# Patient Record
Sex: Male | Born: 1955 | Race: White | Hispanic: No | Marital: Married | Smoking: Former smoker
Health system: Southern US, Community
[De-identification: ages and names within clinical notes are randomized; demographics above are authoritative.]

## PROBLEM LIST (undated history)

## (undated) DIAGNOSIS — M1990 Unspecified osteoarthritis, unspecified site: Secondary | ICD-10-CM

## (undated) DIAGNOSIS — E785 Hyperlipidemia, unspecified: Secondary | ICD-10-CM

## (undated) DIAGNOSIS — N529 Male erectile dysfunction, unspecified: Secondary | ICD-10-CM

## (undated) DIAGNOSIS — Z9989 Dependence on other enabling machines and devices: Secondary | ICD-10-CM

## (undated) DIAGNOSIS — R42 Dizziness and giddiness: Secondary | ICD-10-CM

## (undated) DIAGNOSIS — E119 Type 2 diabetes mellitus without complications: Secondary | ICD-10-CM

## (undated) DIAGNOSIS — G4733 Obstructive sleep apnea (adult) (pediatric): Secondary | ICD-10-CM

## (undated) DIAGNOSIS — R51 Headache: Secondary | ICD-10-CM

## (undated) DIAGNOSIS — R519 Headache, unspecified: Secondary | ICD-10-CM

## (undated) DIAGNOSIS — I10 Essential (primary) hypertension: Secondary | ICD-10-CM

## (undated) DIAGNOSIS — R209 Unspecified disturbances of skin sensation: Secondary | ICD-10-CM

## (undated) HISTORY — DX: Headache, unspecified: R51.9

## (undated) HISTORY — DX: Dependence on other enabling machines and devices: Z99.89

## (undated) HISTORY — DX: Essential (primary) hypertension: I10

## (undated) HISTORY — DX: Unspecified osteoarthritis, unspecified site: M19.90

## (undated) HISTORY — DX: Obstructive sleep apnea (adult) (pediatric): G47.33

## (undated) HISTORY — DX: Male erectile dysfunction, unspecified: N52.9

## (undated) HISTORY — DX: Type 2 diabetes mellitus without complications: E11.9

## (undated) HISTORY — DX: Headache: R51

## (undated) HISTORY — DX: Dizziness and giddiness: R42

## (undated) HISTORY — DX: Unspecified disturbances of skin sensation: R20.9

## (undated) HISTORY — DX: Hyperlipidemia, unspecified: E78.5

---

## 2009-04-11 ENCOUNTER — Emergency Department (HOSPITAL_COMMUNITY): Admission: EM | Admit: 2009-04-11 | Discharge: 2009-04-11 | Payer: Self-pay | Admitting: Emergency Medicine

## 2009-09-19 ENCOUNTER — Encounter: Payer: Self-pay | Admitting: Neurology

## 2013-02-28 ENCOUNTER — Encounter: Payer: Self-pay | Admitting: Neurology

## 2013-03-12 ENCOUNTER — Ambulatory Visit (INDEPENDENT_AMBULATORY_CARE_PROVIDER_SITE_OTHER): Payer: BC Managed Care – PPO | Admitting: Neurology

## 2013-03-12 ENCOUNTER — Encounter (INDEPENDENT_AMBULATORY_CARE_PROVIDER_SITE_OTHER): Payer: Self-pay

## 2013-03-12 ENCOUNTER — Encounter: Payer: Self-pay | Admitting: Neurology

## 2013-03-12 VITALS — BP 124/80 | HR 77 | Resp 16 | Ht 72.0 in | Wt 258.0 lb

## 2013-03-12 DIAGNOSIS — I10 Essential (primary) hypertension: Secondary | ICD-10-CM

## 2013-03-12 DIAGNOSIS — E1149 Type 2 diabetes mellitus with other diabetic neurological complication: Secondary | ICD-10-CM

## 2013-03-12 DIAGNOSIS — G4733 Obstructive sleep apnea (adult) (pediatric): Secondary | ICD-10-CM

## 2013-03-12 DIAGNOSIS — Z9989 Dependence on other enabling machines and devices: Principal | ICD-10-CM

## 2013-03-12 NOTE — Progress Notes (Addendum)
[facility] SLEEP MEDICINE CONSULT   Provider:  Melvyn Novas, M D  Referring Provider: Allean Found, * Primary Care Physician:  Delphia Grates  Chief Complaint  Patient presents with  . New Evaluation    Room   . sleep consult    HPI:  Darin Conner is a 58 y.o. male  Is seen here as a referral  from Dr. Merri Brunette at [facility], for an patient  That has been tested and diagnosed with sleep apnea , and is  treated with CPAP.    Darin Conner, a 58 year old Caucasian right-handed gentleman, presents here as a new patient to the sleep clinic.  Darin Conner reports, that he had been just recently tested for sleep apnea and was diagnosed with obstructive sleep apnea at [facility] in [facility].  From there, he was referred to a durable medical equipment company and as he stated " he had some disagreement " with the CPAP issuing DME.   The patient felt excessively sleepy in  daytime and has complained of a lack of energy when he was originally referred for his sleep study.  He works regular daytime hours, beginning at 8 to 4:30 PM. His sleep habits are as follow between pen 30 and 11 he will fall asleep fairly promptly he has nocturia break, on a regular night 1 or 2, and he has to arise at 5-5:30 AM. His wife will set an alarm , yet he wakes up frequently spontaneously. He noted being alert and much more  refreshed in the morning since using CPAP compliantly.  The sleep studies documented the following: The patient had a sleep latency of 32 minutes and a sleep efficiency of only 71.2%.  He slept for more than 40% of the night and supine sleep position- but the REM sleep was only noted when he slept on his left side.  An  AHI index of 81.4 was stated for the left lateral position and a 54.24 the supine position. The RDIs were similar.  AHI for  total sleep time was 48.4 /hr. . CPAP titration was initiated and the patient titrated to 11 cm  water ,with an AHI of 0.0 . There was 100% supine sleep and it was noted that his hypoxemia resolved on the CPAP. He developed periodic limb movements with CPAP therapy.  He was fitted with a full face mask which began pinching his nose and he still has visible.pressure marks. He felt uncomfortable "had to  Negotiate" with the [employer] to get a new mask.  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  PMHx: Dr. Merri Brunette office stated that the patient has a known history of diabetes which has  become less controlled over the last 12 months. He was treated for essential hypertension, mainly with diuretics and calcium channel blocker,  and he has gastroesophageal reflux disease.He used to be a patient of Dr. Elvera Lennox.  Laboratory results from [facility] physic and an increase in HB A1c now at 5.9, his cholesterol panel on treatment had improved.  There were normal liver and kidney function snorted. The patient requested a referral to see a doctor for sleep apnea followup. He was last seen on 01/08/2013 at the time was a very high fasting glucose of 180 mg/dL.   Review of Systems: Out of a complete 14 system review, the patient complains of only the following symptoms, and all other reviewed systems are negative.  The patient  endorses fatigue severity scale at 19 points and the Epworth sleepiness score at 8 points while  on treatment.  I was able to see a compliance record obtained here in office today encompassing 3 months of use. 102 days the patient has 100% compliance and a residual AHI of 2.2 the time using CPAP daily at 6 hours and 52 minutes.  Overall the patient is a storybook compliant patient.  CPAP set at 11 cm water.    History   Social History  . Marital Status: Married    Spouse Name: Joni Reiningicole    Number of Children: 5  . Years of Education: GED   Occupational History  . Carpenter    Social History Main Topics  . Smoking status: Former Games developermoker  . Smokeless  tobacco: Never Used     Comment: 2003  . Alcohol Use: 0.0 oz/week     Comment: twice a week  . Drug Use: No  . Sexual Activity: Not on file   Other Topics Concern  . Not on file   Social History Narrative   Patient is married Joni Reining(Nicole).   Patient has five children.   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   Patient has a Ged.   Patient drinks 3 cups of coffee daily.   Patient is right-handed.             Family History  Problem Relation Age of Onset  . Diabetes Father   . Prostate cancer Father   . Cancer Paternal Aunt   . Diabetes Sister     Past Medical History  Diagnosis Date  . Hyperlipidemia   . Diabetes   . Erectile dysfunction   . Osteoarthritis   . Disturbance of skin sensation   . OSA on CPAP   . Hypertension   . Headache     History reviewed. No pertinent past surgical history.  Current Outpatient Prescriptions  Medication Sig Dispense Refill  . amLODipine (NORVASC) 5 MG tablet Take 5 mg by mouth daily.      Marland Kitchen. aspirin 81 MG tablet Take 81 mg by mouth daily.      . Glucosamine-Chondroitin (COSAMIN DS PO) Take 1 capsule by mouth daily.      Marland Kitchen. glucose blood test strip 1 each by Other route 2 (two) times daily. Use as instructed      . metFORMIN (GLUCOPHAGE-XR) 500 MG 24 hr tablet Take 500 mg by mouth daily with breakfast.      . Omega-3 Fatty Acids (OMEGA 3 PO) Take 1 capsule by mouth daily.      . pravastatin (PRAVACHOL) 20 MG tablet Take 20 mg by mouth daily.      Marland Kitchen. triamterene-hydrochlorothiazide (DYAZIDE) 37.5-25 MG per capsule Take 1 capsule by mouth daily.      . valsartan (DIOVAN) 320 MG tablet Take 320 mg by mouth daily.       No current facility-administered medications for this visit.    Allergies as of 03/12/2013 - Review Complete 03/12/2013  Allergen Reaction Noted  . Penicillins  03/12/2013    Vitals: BP 124/80  Pulse 77  Resp 16  Ht 6' (1.829 m)  Wt 258 lb (117.028 kg)  BMI 34.98 kg/m2 Last Weight:   Wt Readings from Last 1 Encounters:  03/12/13 258 lb (117.028 kg)   Last Height:   Ht Readings from Last 1 Encounters:  03/12/13 6' (1.829 m)    Physical exam:  General: The patient is awake, alert and appears  not in acute distress. The patient is well groomed. Head: Normocephalic, atraumatic. Neck is supple. Mallampati 3 , neck circumference: 15.5  Cardiovascular:  Regular rate and rhythm , without  murmurs or carotid bruit, and without distended neck veins. Respiratory: Lungs are clear to auscultation. Skin:  Without evidence of edema, or rash Trunk: BMI is elevated , the patient has a normal posture.  Neurologic exam : The patient is awake and alert, oriented to place and time.  Memory subjective described as intact.  There is a normal attention span & concentration ability. Speech is fluent without  dysarthria, dysphonia or aphasia.  Mood and affect are appropriate.  Cranial nerves: Pupils are equal and briskly reactive to light. Funduscopic exam without  evidence of pallor or edema. Extraocular movements  in vertical and horizontal planes intact and without nystagmus. Visual fields by finger perimetry are intact. Hearing to finger rub intact.  Facial sensation intact to fine touch. Facial motor strength is symmetric and tongue and uvula move midline.  Motor exam:   Normal tone and normal muscle bulk and symmetric normal strength in all extremities.  Sensory:  Fine touch, pinprick and vibration were tested in all extremities. He has lost vibration on both feet, all toes up to the ankle. His feet are warm.  Proprioception in upper extremities is normal.  Coordination: Rapid alternating movements in the fingers/hands is tested and normal. Finger-to-nose maneuver tested and normal without evidence of ataxia, dysmetria or tremor.  Gait and station: Patient walks without assistive device. Deep tendon reflexes: in the  upper and lower extremities are all attenuated, achilles is  absent  Babinski maneuver response is difficult to elicit , but downgoing.   Assessment:  After physical and neurologic examination, review of laboratory studies, imaging, neurophysiology testing and pre-existing records, assessment is  Severe OSA with REM accentuation. He needs no new supplies at this time, established 100% compliance.  He feels the pressure is a little to high , and i will reset the machine from 12 to 11 cm. With a flex function.   Plan:  Treatment plan and additional workup :  Diabetic, overweight patient with HTN, patient is educated about low carb diet but fails to implement these.  The patient has OSA and will use CPAP at 11 cm water with a 2 cm flex. He will follow up once a year with the CPAP, Epworth .   Nasal saline spray recommended for nasal rhinitis , dry nostril irritation.   Neuropathy is present -likely diabetic.

## 2013-03-12 NOTE — Patient Instructions (Signed)
Apnea CPAP and BIPAP Information CPAP and BIPAP are methods of helping you breathe with the use of air pressure. CPAP stands for "continuous positive airway pressure." BIPAP stands for "bi-level positive airway pressure." In both methods, air is blown into your air passages to help keep you breathing well. With CPAP, the amount of pressure stays the same while you breathe in and out. CPAP is most commonly used for obstructive sleep apnea. For obstructive sleep apnea, CPAP works by holding your airways open so that they do not collapse when your muscles relax during sleep. BIPAP is similar to CPAP except the amount of pressure is increased when you inhale. This helps you take larger breaths. Your health care provider will recommend whether CPAP or BIPAP would be more helpful for you.  WHY ARE CPAP AND BIPAP TREATMENTS USED? CPAP or BIPAP can be helpful if you have:   Sleep apnea.   Chronic obstructive pulmonary disease (COPD).   Diseases that weaken the muscles of the chest, including muscular dystrophy or neurological diseases such as amyotrophic lateral sclerosis (ALS).   Other problems that cause breathing to be weak, abnormal, or difficult.  HOW IS CPAP OR BIPAP ADMINISTERED? Both CPAP and BIPAP are provided by a small machine with a flexible plastic tube that attaches to a plastic mask. The mask fits on your face, and air is blown into your air passages through your nose or mouth. The amount of pressure that is used to blow the air into your air passages can be set on the machine. Your health care provider will determine the pressure setting that should be used based on your individual needs.  WHEN SHOULD CPAP OR BIPAP BE USED? In most cases, the mask is worn only when sleeping. Generally, you will need to wear the mask throughout the night and during the daytime if you take a nap. In a few cases involving certain medical conditions, people also need to wear the mask at other times when they  are awake. Follow your health care provider's instructions for when to use the machine.  USING THE MASK  Because the mask needs to be snug, some people feel a trapped or closed-in feeling (claustrophobic) when first using the mask. You may need to get used to the mask gradually. To do this, you can first hold the mask loosely over your nose or mouth. Gradually apply the mask more snugly. You can also gradually increase the amount of time that you use the mask.   Masks are available in various types and sizes. Some fit over your mouth and nose, and some fit over just your nose. If your mask does not fit well, talk to your health care provider about getting a different one.  If you are using a nasal mask and you tend to breathe through your mouth, a chin strap may be applied to help keep your mouth closed.   The CPAP and BIPAP machines have alarms that may sound if the mask comes off or develops a leak.   If you have trouble with the mask, it is very important that you talk to your health care provider about finding a way to make the mask easier to tolerate. Do not stop using the mask. This could have a negative impact on your health. TIPS FOR USING THE MACHINE  Place your CPAP or BIPAP machine on a secure table or stand near an electrical outlet.   Know where the on-off switch is located on the machine.  Follow your health care provider's instructions for how to set the pressure on your machine and when you should use it.   Do not eat or drink while the CPAP or BIPAP machine is on. Food or fluids could get pushed into your lungs by the pressure of the CPAP or BIPAP.  Do not smoke. Tobacco smoke residue can damage the machine.   For home use, CPAP and BIPAP machines can be rented or purchased through home health care companies. Many different brands of machines are available. Renting a machine before purchasing may help you find out which particular machine works well for you. SEEK  IMMEDIATE MEDICAL CARE IF:  You have redness or open areas around your nose or mouth where the mask fits.   You have trouble operating the CPAP or BIPAP machine.   You cannot tolerate wearing the CPAP or BIPAP mask.  Document Released: 10/15/2003 Document Revised: 09/18/2012 Document Reviewed: 08/15/2012 ExitCare Patient Information 2014 [employer]. Sleep Apnea  Sleep apnea is a sleep disorder characterized by abnormal pauses in breathing while you sleep. When your breathing pauses, the level of oxygen in your blood decreases. This causes you to move out of deep sleep and into light sleep. As a result, your quality of sleep is poor, and the system that carries your blood throughout your body (cardiovascular system) experiences stress. If sleep apnea remains untreated, the following conditions can develop:  High blood pressure (hypertension).  Coronary artery disease.  Inability to achieve or maintain an erection (impotence).  Impairment of your thought process (cognitive dysfunction). There are three types of sleep apnea: 1. Obstructive sleep apnea Pauses in breathing during sleep because of a blocked airway. 2. Central sleep apnea Pauses in breathing during sleep because the area of the brain that controls your breathing does not send the correct signals to the muscles that control breathing. 3. Mixed sleep apnea A combination of both obstructive and central sleep apnea. RISK FACTORS The following risk factors can increase your risk of developing sleep apnea:  Being overweight.  Smoking.  Having narrow passages in your nose and throat.  Being of older age.  Being male.  Alcohol use.  Sedative and tranquilizer use.  Ethnicity. Among individuals younger than 35 years, African Americans are at increased risk of sleep apnea. SYMPTOMS   Difficulty staying asleep.  Daytime sleepiness and fatigue.  Loss of energy.  Irritability.  Loud, heavy snoring.  Morning  headaches.  Trouble concentrating.  Forgetfulness.  Decreased interest in sex. DIAGNOSIS  In order to diagnose sleep apnea, your caregiver will perform a physical examination. Your caregiver may suggest that you take a home sleep test. Your caregiver may also recommend that you spend the night in a sleep lab. In the sleep lab, several monitors record information about your heart, lungs, and brain while you sleep. Your leg and arm movements and blood oxygen level are also recorded. TREATMENT The following actions may help to resolve mild sleep apnea:  Sleeping on your side.   Using a decongestant if you have nasal congestion.   Avoiding the use of depressants, including alcohol, sedatives, and narcotics.   Losing weight and modifying your diet if you are overweight. There also are devices and treatments to help open your airway:  Oral appliances. These are custom-made mouthpieces that shift your lower jaw forward and slightly open your bite. This opens your airway.  Devices that create positive airway pressure. This positive pressure "splints" your airway open to help you breathe   better during sleep. The following devices create positive airway pressure:  Continuous positive airway pressure (CPAP) device. The CPAP device creates a continuous level of air pressure with an air pump. The air is delivered to your airway through a mask while you sleep. This continuous pressure keeps your airway open.  Nasal expiratory positive airway pressure (EPAP) device. The EPAP device creates positive air pressure as you exhale. The device consists of single-use valves, which are inserted into each nostril and held in place by adhesive. The valves create very little resistance when you inhale but create much more resistance when you exhale. That increased resistance creates the positive airway pressure. This positive pressure while you exhale keeps your airway open, making it easier to breath when you  inhale again.  Bilevel positive airway pressure (BPAP) device. The BPAP device is used mainly in patients with central sleep apnea. This device is similar to the CPAP device because it also uses an air pump to deliver continuous air pressure through a mask. However, with the BPAP machine, the pressure is set at two different levels. The pressure when you exhale is lower than the pressure when you inhale.  Surgery. Typically, surgery is only done if you cannot comply with less invasive treatments or if the less invasive treatments do not improve your condition. Surgery involves removing excess tissue in your airway to create a wider passage way. Document Released: 01/06/2002 Document Revised: 05/13/2012 Document Reviewed: 05/25/2011 ExitCare Patient Information 2014 [employer].  

## 2013-03-14 ENCOUNTER — Encounter: Payer: Self-pay | Admitting: Neurology

## 2013-12-17 ENCOUNTER — Encounter: Payer: Self-pay | Admitting: Neurology

## 2014-03-12 ENCOUNTER — Encounter: Payer: Self-pay | Admitting: Neurology

## 2014-03-12 ENCOUNTER — Ambulatory Visit (INDEPENDENT_AMBULATORY_CARE_PROVIDER_SITE_OTHER): Payer: BLUE CROSS/BLUE SHIELD | Admitting: Neurology

## 2014-03-12 ENCOUNTER — Ambulatory Visit: Payer: BC Managed Care – PPO | Admitting: Nurse Practitioner

## 2014-03-12 VITALS — BP 117/74 | HR 71 | Resp 18 | Ht 71.75 in | Wt 250.0 lb

## 2014-03-12 DIAGNOSIS — R5383 Other fatigue: Secondary | ICD-10-CM

## 2014-03-12 DIAGNOSIS — E1310 Other specified diabetes mellitus with ketoacidosis without coma: Secondary | ICD-10-CM

## 2014-03-12 DIAGNOSIS — E1110 Type 2 diabetes mellitus with ketoacidosis without coma: Secondary | ICD-10-CM | POA: Insufficient documentation

## 2014-03-12 DIAGNOSIS — Z9989 Dependence on other enabling machines and devices: Principal | ICD-10-CM

## 2014-03-12 DIAGNOSIS — R351 Nocturia: Secondary | ICD-10-CM

## 2014-03-12 DIAGNOSIS — G479 Sleep disorder, unspecified: Secondary | ICD-10-CM | POA: Insufficient documentation

## 2014-03-12 DIAGNOSIS — G4733 Obstructive sleep apnea (adult) (pediatric): Secondary | ICD-10-CM

## 2014-03-12 NOTE — Progress Notes (Signed)
[facility] SLEEP MEDICINE CONSULT   Provider:  Melvyn Novas, M D  Referring Provider: Elvera Lennox, PA-C Primary Care Physician:  Leanor Rubenstein, MD  Chief Complaint  Patient presents with  . RV cpap    Rm 11, alone    HPI:  Darin Conner is a 59 y.o. male  Is seen here as a referral  from Dr. Ellison Hughs at [facility], for an patient  That has been tested and diagnosed with sleep apnea , and is  treated with CPAP.    Darin Conner, a 59 year old Caucasian right-handed gentleman, presents here as a new patient to the sleep clinic. PMHx: Dr. Merri Brunette office stated that the patient has a known history of diabetes which has  become less controlled over the last 12 months. He was treated for essential hypertension, mainly with diuretics and calcium channel blocker,  and he has gastroesophageal reflux disease.He used to be a patient of Dr. Elvera Lennox.  Laboratory results from [facility] physic and an increase in HB A1c now at 5.9, his cholesterol panel on treatment had improved.  There were normal liver and kidney function snorted. The patient requested a referral to see a doctor for sleep apnea followup.  He was last seen on 01/08/2013 at the time was a very high fasting glucose of 180 mg/dL.  Darin Conner reports, that he had been just recently tested for sleep apnea and was diagnosed with obstructive sleep apnea at [facility] in [facility].  From there, he was referred to a durable medical equipment company and as he stated " he had some disagreement " with the CPAP issuing DME. The patient felt excessively sleepy in  daytime and has complained of a lack of energy when he was originally referred for his sleep study. He works regular daytime hours, beginning at 8 to 4:30 PM. His sleep habits are as follow between pen 30 and 11 he will fall asleep fairly promptly he has nocturia break, on a regular night 1 or 2, and he has to arise at 5-5:30 AM. His wife  will set an alarm , yet he wakes up frequently spontaneously. He noted being alert and much more  refreshed in the morning since using CPAP compliantly.  The sleep studies documented the following: The patient had a sleep latency of 32 minutes and a sleep efficiency of only 71.2%.  He slept for more than 40% of the night and supine sleep position- but the REM sleep was only noted when he slept on his left side. An  AHI index of 81.4 was stated for the left lateral position and a 54.24 the supine position. The RDIs were similar. AHI for  total sleep time was 48.4 /hr. . CPAP titration was initiated and the patient titrated to 11 cm water ,with an AHI of 0.0 . There was 100% supine sleep and it was noted that his hypoxemia resolved on the CPAP. He developed periodic limb movements with CPAP therapy.  He was fitted with a full face mask which began pinching his nose and he still has visible.pressure marks. He felt uncomfortable "had to  Negotiate" with the [employer] to get a new mask. 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  The patient is seen here on 03-12-14 after continued to use his old established CPAP machine with new settings. A download confirmed a 2.2 residual AHI at a CPAP pressure of 12 cm water. This was on 2-10  2015. He feels a little better since he got new equipment mask tubing etc. that he still feels not up to 100%. Also his average residual AHI was very low I wonder if the patient would do better with an auto titration device. His machine is also partially broken, I think in that case he needs a new machine. He has been followed by pediatric specialists as his DME. A download from 03-11-14 obtained in the office shows 100% compliance at 6 hours and 30 minutes average with a average AHI of 2.6 at 11 cm water. Darin Conner  endorsed his fatigue severity scale at 33 points today and the Epworth Sleepiness Scale at 6 points his fatigue score is actually higher than last time  his sleepiness score is lower.  Review of Systems: Out of a complete 14 system review, the patient complains of only the following symptoms, and all other reviewed systems are negative. The patient endorses fatigue severity scale at 33 points and the Epworth sleepiness score at 6 points while  on treatment.   History   Social History  . Marital Status: Married    Spouse Name: Darin Reiningicole  . Number of Children: 5  . Years of Education: GED   Occupational History  . [employer]    Social History Main Topics  . Smoking status: Former Games developermoker  . Smokeless tobacco: Never Used     Comment: Darin Conner  . Alcohol Use: 0.0 oz/week     Comment: twice a week  . Drug Use: No  . Sexual Activity: Not on file   Other Topics Concern  . Not on file   Social History Narrative   Patient is married Darin Conner(Nicole).   Patient has five children.   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   Patient has a Ged.   Patient drinks 3-4 cups of coffee daily.   Patient is right-handed.             Family History  Problem Relation Age of Onset  . Diabetes Father   . Prostate cancer Father   . Cancer Paternal Aunt   . Diabetes Sister     Past Medical History  Diagnosis Date  . Hyperlipidemia   . Diabetes   . Erectile dysfunction   . Osteoarthritis   . Disturbance of skin sensation   . OSA on CPAP   . Hypertension   . Headache     History reviewed. No pertinent past surgical history.  Current Outpatient Prescriptions  Medication Sig Dispense Refill  . aspirin 81 MG tablet Take 81 mg by mouth daily.    . AZOR 5-40 MG per tablet Take 1 tablet by mouth daily.   4  . Glucosamine-Chondroitin (COSAMIN DS PO) Take 1 capsule by mouth daily.    Marland Kitchen. glucose blood test strip 1 each by Other route 2 (two) times daily. Use as instructed    . INVOKANA 300 MG TABS tablet   4  . JANUMET 50-1000 MG per tablet   4  . Omega-3 Fatty Acids (OMEGA 3 PO) Take 1 capsule by mouth daily.    . pravastatin  (PRAVACHOL) 20 MG tablet Take 20 mg by mouth daily.    Marland Kitchen. triamterene-hydrochlorothiazide (DYAZIDE) 37.5-25 MG per capsule Take 1 capsule by mouth daily.    Marland Kitchen. amLODipine (NORVASC) 5 MG tablet Take 5 mg by mouth daily.     No current facility-administered medications for this visit.    Allergies as of 03/12/2014 - Review Complete 03/12/2014  Allergen Reaction Noted  . Penicillins  03/12/2013    Vitals: BP 117/74 mmHg  Pulse 71  Resp 18  Ht 5' 11.75" (1.822 m)  Wt 250 lb (113.399 kg)  BMI 34.16 kg/m2 Last Weight:  Wt Readings from Last 1 Encounters:  03/12/14 250 lb (113.399 kg)   Last Height:   Ht Readings from Last 1 Encounters:  03/12/14 5' 11.75" (1.822 m)    Physical exam:  General: The patient is awake, alert and appears not in acute distress. The patient is well groomed. Head: Normocephalic, atraumatic. Neck is supple. Mallampati 3 , neck circumference: 15.5  Cardiovascular:  Regular rate and rhythm , without  murmurs or carotid bruit, and without distended neck veins. Respiratory: Lungs are clear to auscultation. Skin:  Without evidence of edema, or rash Trunk: BMI is elevated , the patient has a normal posture.  Neurologic exam : The patient is awake and alert, oriented to place and time.  Memory subjective described as intact.  There is a normal attention span & concentration ability. Speech is fluent without  dysarthria, dysphonia or aphasia.  Mood and affect are appropriate.  Cranial nerves: Pupils are equal and briskly reactive to light. Funduscopic exam without  evidence of pallor or edema. Extraocular movements  in vertical and horizontal planes intact and without nystagmus. Visual fields by finger perimetry are intact. Hearing to finger rub intact.  Facial sensation intact to fine touch. Facial motor strength is symmetric and tongue and uvula move midline.   Assessment:  After physical and neurologic examination, review of laboratory studies, imaging,  neurophysiology testing and pre-existing records, assessment is  Severe OSA with REM accentuation. He needs no new supplies at this time, established 100% compliance.  He feels the pressure is a little to high , and i had reset the machine from 12 to 11 cm.  He achieved a low AHI of 2.6 but feels not quite right yet and the machine is broken causes noises that is not reliably blowing. I will therefore order a new machine this time an O2 sat machine with a pressure between 5 and 15 cm water at nasal mask of the patient's choice he can use either the device off format he has used so far or switch to a new one and I would like to refer him back to adult and Pediatric service  specialists DME>   [phone]   Plan:  Treatment plan and additional workup : 1)Diabetic, overweight patient with HTN, patient is educated about low carb diet but fails to implement these.  Has very iregular work hours and is SOB easily, too.  The patient has OSA and will need CPAP, the new machine  Will be auto-set.  5 -15 cm water .  He will follow up once a year with the CPAP, Epworth .   Nasal saline spray recommended for nasal rhinitis , dry nostril irritation. May try a CPAP gel.   NEW machine ordered.

## 2014-04-27 ENCOUNTER — Encounter: Payer: Self-pay | Admitting: Neurology

## 2014-06-11 ENCOUNTER — Encounter: Payer: Self-pay | Admitting: Nurse Practitioner

## 2014-06-11 ENCOUNTER — Ambulatory Visit (INDEPENDENT_AMBULATORY_CARE_PROVIDER_SITE_OTHER): Payer: BLUE CROSS/BLUE SHIELD | Admitting: Nurse Practitioner

## 2014-06-11 VITALS — BP 115/75 | HR 69 | Ht 71.5 in | Wt 253.0 lb

## 2014-06-11 DIAGNOSIS — G479 Sleep disorder, unspecified: Secondary | ICD-10-CM

## 2014-06-11 DIAGNOSIS — R5383 Other fatigue: Secondary | ICD-10-CM

## 2014-06-11 DIAGNOSIS — G4733 Obstructive sleep apnea (adult) (pediatric): Secondary | ICD-10-CM | POA: Diagnosis not present

## 2014-06-11 DIAGNOSIS — Z9989 Dependence on other enabling machines and devices: Principal | ICD-10-CM

## 2014-06-11 NOTE — Patient Instructions (Signed)
Continue CPAP settings same Follow-up yearly and when necessary

## 2014-06-11 NOTE — Progress Notes (Signed)
[facility]  PATIENT: Darin Conner DOB: [birth year]   REASON FOR VISIT: Follow-up for obstructive sleep apnea HISTORY FROM: Patient    HISTORY OF PRESENT ILLNESS: Darin Conner, 59 year old male returns for follow-up for his obstructive sleep apnea. He was last seen in this office by Darin Conner 03/12/2014. At that time he was asked to get a new CPAP machine which he has done. His CPAP download shows 100% compliance at 5 hours and 47 minutes average with a average AHI of 1.0 at auto titration. He claims he is less fatigued however he does state; his medications cause fatigue as well. ESS score is 11. He returns for reevaluation    HISTORY:(CD)Darin Conner, a 59 year old Caucasian right-handed gentleman, presents here as a new patient to the sleep clinic. PMHx: Darin Conner office stated that the patient has a known history of diabetes which has become less controlled over the last 12 months. He was treated for essential hypertension, mainly with diuretics and calcium channel blocker, and he has gastroesophageal reflux disease.He used to be a patient of Darin Conner. Laboratory results from [facility] physic and an increase in HB A1c now at 5.9, his cholesterol panel on treatment had improved.  There were normal liver and kidney function snorted. The patient requested a referral to see a doctor for sleep apnea followup.  He was last seen on 01/08/2013 at the time was a very high fasting glucose of 180 mg/dL.  Darin Conner reports, that he had been just recently tested for sleep apnea and was diagnosed with obstructive sleep apnea at [facility] in [facility].  From there, he was referred to a durable medical equipment company and as he stated " he had some disagreement " with the CPAP issuing DME. The patient felt excessively sleepy in daytime and has complained of a lack of energy when he was originally referred for his sleep study. He works regular daytime  hours, beginning at 8 to 4:30 PM. His sleep habits are as follow between pen 30 and 11 he will fall asleep fairly promptly he has nocturia break, on a regular night 1 or 2, and he has to arise at 5-5:30 AM. His wife will set an alarm , yet he wakes up frequently spontaneously. He noted being alert and much more refreshed in the morning since using CPAP compliantly.  The sleep studies documented the following: The patient had a sleep latency of 32 minutes and a sleep efficiency of only 71.2%.  He slept for more than 40% of the night and supine sleep position- but the REM sleep was only noted when he slept on his left side. An AHI index of 81.4 was stated for the left lateral position and a 54.24 the supine position. The RDIs were similar. AHI for total sleep time was 48.4 /hr. . CPAP titration was initiated and the patient titrated to 11 cm water ,with an AHI of 0.0 . There was 100% supine sleep and it was noted that his hypoxemia resolved on the CPAP. He developed periodic limb movements with CPAP therapy.  He was fitted with a full face mask which began pinching his nose and he still has visible.pressure marks. He felt uncomfortable "had to Negotiate" with the [employer] to get a new mask. 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  The patient is seen here on 03-12-14 after continued to use his old established CPAP machine with new settings. A download confirmed a 2.2 residual  AHI at a CPAP pressure of 12 cm water. This was on 2-10 2015. He feels a little better since he got new equipment mask tubing etc. that he still feels not up to 100%. Also his average residual AHI was very low I wonder if the patient would do better with an auto titration device. His machine is also partially broken, I think in that case he needs a new machine. He has been followed by pediatric specialists as his DME. A download from 03-11-14 obtained in the office shows 100% compliance at 6 hours and 30 minutes  average with a average AHI of 2.6 at 11 cm water. Darin Conner endorsed his fatigue severity scale at 33 points today and the Epworth Sleepiness Scale at 6 points his fatigue score is actually higher than last time his sleepiness score is lower.    REVIEW OF SYSTEMS: Full 14 system review of systems performed and notable only for those listed, all others are neg:  Constitutional: Fatigue Cardiovascular: neg Ear/Nose/Throat: neg  Skin: neg Eyes: Itching Respiratory: neg Gastroitestinal: neg  Hematology/Lymphatic: neg  Endocrine: neg Musculoskeletal: Joint pain, aching muscles  Allergy/Immunology: neg Neurological: Dizziness Psychiatric: Anxiety depression Sleep : Obstructive sleep apnea with CPAP ALLERGIES: Allergies  Allergen Reactions  . Penicillins     HOME MEDICATIONS: Outpatient Prescriptions Prior to Visit  Medication Sig Dispense Refill  . aspirin 81 MG tablet Take 81 mg by mouth daily.    . AZOR 5-40 MG per tablet Take 1 tablet by mouth daily.   4  . Glucosamine-Chondroitin (COSAMIN DS PO) Take 1 capsule by mouth daily.    Marland Kitchen. glucose blood test strip 1 each by Other route 2 (two) times daily. Use as instructed    . JANUMET 50-1000 MG per tablet   4  . Omega-3 Fatty Acids (OMEGA 3 PO) Take 1 capsule by mouth daily.    . pravastatin (PRAVACHOL) 20 MG tablet Take 20 mg by mouth daily.    Marland Kitchen. triamterene-hydrochlorothiazide (DYAZIDE) 37.5-25 MG per capsule Take 1 capsule by mouth daily.    Marland Kitchen. amLODipine (NORVASC) 5 MG tablet Take 5 mg by mouth daily.    . INVOKANA 300 MG TABS tablet   4   No facility-administered medications prior to visit.    PAST MEDICAL HISTORY: Past Medical History  Diagnosis Date  . Hyperlipidemia   . Diabetes   . Erectile dysfunction   . Osteoarthritis   . Disturbance of skin sensation   . OSA on CPAP   . Hypertension   . Headache   . Dizziness     PAST SURGICAL HISTORY: History reviewed. No pertinent past surgical history.  FAMILY  HISTORY: Family History  Problem Relation Age of Onset  . Diabetes Father   . Prostate cancer Father   . Cancer Paternal Aunt   . Diabetes Sister     SOCIAL HISTORY: History   Social History  . Marital Status: Married    Spouse Name: Joni Reiningicole  . Number of Children: 5  . Years of Education: GED   Occupational History  . [employer]    Social History Main Topics  . Smoking status: Former Games developermoker  . Smokeless tobacco: Never Used     Comment: 2003  . Alcohol Use: 0.0 oz/week     Comment: twice a week  . Drug Use: No  . Sexual Activity: Not on file   Other Topics Concern  . Not on file   Social History Narrative   Patient is married Joni Reining(Nicole).  Patient has five children.   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   Patient has a Ged.   Patient drinks 3-4 cups of coffee daily.   Patient is right-handed.              PHYSICAL EXAM  Filed Vitals:   06/11/14 1032  BP: 115/75  Pulse: 69  Height: 5' 11.5" (1.816 m)  Weight: 253 lb (114.76 kg)   Body mass index is 34.8 kg/(m^2). General: The patient is awake, alert and appears not in acute distress. The patient is well groomed. Head: Normocephalic, atraumatic.  Neck is supple. Mallampati 3 , neck circumference: 15.5  Cardiovascular: Regular rate and rhythm ,  Respiratory: Lungs are clear to auscultation. Skin: Without evidence of edema, or rash Trunk: BMI is elevated , the patient has a normal posture.  Neurologic exam : The patient is awake and alert, oriented to place and time. Memory subjective described as intact. There is a normal attention span & concentration ability. Speech is fluent without dysarthria, dysphonia or aphasia.Mood and affect are appropriate.ESS 11 Cranial nerves:Pupils are equal and briskly reactive to light. Funduscopic exam without evidence of pallor or edema. Extraocular movements in vertical and horizontal planes intact and without nystagmus. Visual fields by  finger perimetry are intact.Hearing to finger rub intact. Facial sensation intact to fine touch. Facial motor strength is symmetric and tongue and uvula move midline. Motor: normal bulk and tone, full strength in the BUE, BLE, fine finger movements normal, no pronator drift. No focal weakness Coordination: finger-nose-finger, heel-to-shin bilaterally, no dysmetria Reflexes: Brachioradialis 2/2, biceps 2/2, triceps 2/2, patellar 2/2, Achilles 2/2, plantar responses were flexor bilaterally. Gait and Station: Rising up from seated position without assistance, normal stance,  moderate stride, good arm swing, smooth turning, able to perform tiptoe, and heel walking without difficulty. Tandem gait is steady  DIAGNOSTIC DATA (LABS, IMAGING, TESTING) -   ASSESSMENT AND PLAN  59 y.o. year old male  has a past medical history of Hyperlipidemia; Diabetes;  Osteoarthritis; Disturbance of skin sensation; OSA on CPAP; Hypertension;  here to follow-up. He recently obtained a new CPAP machine.His CPAP download shows 100% compliance at 5 hours and 47 minutes average with a average AHI of 1.0 at auto titration  Continue CPAP settings same Follow-up yearly and when necessary Nilda RiggsNancy Carolyn Aviv Rota, [facility] BostonGNP, [facility] Partners[facility], APRN  [facility] [address], [address] [place], [place] [address] [phone]

## 2014-06-12 NOTE — Progress Notes (Signed)
I agree with the assessment and plan as directed by NP .The patient is known to me .   Jhayden Demuro, MD  

## 2014-07-21 ENCOUNTER — Emergency Department (HOSPITAL_COMMUNITY)
Admission: EM | Admit: 2014-07-21 | Discharge: 2014-07-21 | Disposition: A | Discharge status: Home / Self Care | Payer: BLUE CROSS/BLUE SHIELD | Attending: Emergency Medicine | Admitting: Emergency Medicine

## 2014-07-21 ENCOUNTER — Encounter (HOSPITAL_COMMUNITY): Payer: Self-pay | Admitting: Emergency Medicine

## 2014-07-21 ENCOUNTER — Emergency Department (HOSPITAL_COMMUNITY): Payer: BLUE CROSS/BLUE SHIELD

## 2014-07-21 DIAGNOSIS — Z87438 Personal history of other diseases of male genital organs: Secondary | ICD-10-CM | POA: Diagnosis not present

## 2014-07-21 DIAGNOSIS — I10 Essential (primary) hypertension: Secondary | ICD-10-CM | POA: Insufficient documentation

## 2014-07-21 DIAGNOSIS — E119 Type 2 diabetes mellitus without complications: Secondary | ICD-10-CM | POA: Insufficient documentation

## 2014-07-21 DIAGNOSIS — E785 Hyperlipidemia, unspecified: Secondary | ICD-10-CM | POA: Diagnosis not present

## 2014-07-21 DIAGNOSIS — Z79899 Other long term (current) drug therapy: Secondary | ICD-10-CM | POA: Insufficient documentation

## 2014-07-21 DIAGNOSIS — M1990 Unspecified osteoarthritis, unspecified site: Secondary | ICD-10-CM | POA: Insufficient documentation

## 2014-07-21 DIAGNOSIS — Y929 Unspecified place or not applicable: Secondary | ICD-10-CM | POA: Insufficient documentation

## 2014-07-21 DIAGNOSIS — Z87891 Personal history of nicotine dependence: Secondary | ICD-10-CM | POA: Diagnosis not present

## 2014-07-21 DIAGNOSIS — Y999 Unspecified external cause status: Secondary | ICD-10-CM | POA: Diagnosis not present

## 2014-07-21 DIAGNOSIS — X58XXXA Exposure to other specified factors, initial encounter: Secondary | ICD-10-CM | POA: Diagnosis not present

## 2014-07-21 DIAGNOSIS — Z9981 Dependence on supplemental oxygen: Secondary | ICD-10-CM | POA: Diagnosis not present

## 2014-07-21 DIAGNOSIS — Z880 Allergy status to penicillin: Secondary | ICD-10-CM | POA: Diagnosis not present

## 2014-07-21 DIAGNOSIS — Y9389 Activity, other specified: Secondary | ICD-10-CM | POA: Diagnosis not present

## 2014-07-21 DIAGNOSIS — G4733 Obstructive sleep apnea (adult) (pediatric): Secondary | ICD-10-CM | POA: Diagnosis not present

## 2014-07-21 DIAGNOSIS — S8991XA Unspecified injury of right lower leg, initial encounter: Secondary | ICD-10-CM

## 2014-07-21 DIAGNOSIS — Z872 Personal history of diseases of the skin and subcutaneous tissue: Secondary | ICD-10-CM | POA: Diagnosis not present

## 2014-07-21 DIAGNOSIS — Z7982 Long term (current) use of aspirin: Secondary | ICD-10-CM | POA: Diagnosis not present

## 2014-07-21 MED ORDER — HYDROCODONE-ACETAMINOPHEN 5-325 MG PO TABS: 1.0000 | ORAL_TABLET | ORAL | Status: DC | PRN

## 2014-07-21 MED ORDER — IBUPROFEN 600 MG PO TABS: 600.0000 mg | ORAL_TABLET | Freq: Four times a day (QID) | ORAL | Status: DC | PRN

## 2014-07-21 MED ORDER — HYDROCODONE-ACETAMINOPHEN 5-325 MG PO TABS
1.0000 | ORAL_TABLET | Freq: Once | ORAL | Status: AC
  Administered 2014-07-21: 1 via ORAL
  Filled 2014-07-21: qty 1

## 2014-07-21 NOTE — ED Notes (Signed)
Per EMS. Pt stepped backwards off step ladder and R knee gave out at work. Pt then drove to another location, and pain became worse. Pt called supervisor who told pt to come here for evaluation.

## 2014-07-21 NOTE — Discharge Instructions (Signed)
There is a question of ligamentous injury on the x-ray. keep your knee elevated, ice several times a day. Take ibuprofen for pain. Norco for severe pain. Immobilizer and crutches as needed. Please follow up with orthopedic specialist for further evaluation and treatment.  Knee Sprain A knee sprain is a tear in one of the strong, fibrous tissues that connect the bones (ligaments) in your knee. The severity of the sprain depends on how much of the ligament is torn. The tear can be either partial or complete. CAUSES  Often, sprains are a result of a fall or injury. The force of the impact causes the fibers of your ligament to stretch too much. This excess tension causes the fibers of your ligament to tear. SIGNS AND SYMPTOMS  You may have some loss of motion in your knee. Other symptoms include:  Bruising.  Pain in the knee area.  Tenderness of the knee to the touch.  Swelling. DIAGNOSIS  To diagnose a knee sprain, your health care provider will physically examine your knee. Your health care provider may also suggest an X-ray exam of your knee to make sure no bones are broken. TREATMENT  If your ligament is only partially torn, treatment usually involves keeping the knee in a fixed position (immobilization) or bracing your knee for activities that require movement for several weeks. To do this, your health care provider will apply a bandage, cast, or splint to keep your knee from moving and to support your knee during movement until it heals. For a partially torn ligament, the healing process usually takes 4-6 weeks. If your ligament is completely torn, depending on which ligament it is, you may need surgery to reconnect the ligament to the bone or reconstruct it. After surgery, a cast or splint may be applied and will need to stay on your knee for 4-6 weeks while your ligament heals. HOME CARE INSTRUCTIONS  Keep your injured knee elevated to decrease swelling.  To ease pain and swelling,  apply ice to the injured area:  Put ice in a plastic bag.  Place a towel between your skin and the bag.  Leave the ice on for 20 minutes, 2-3 times a day.  Only take medicine for pain as directed by your health care provider.  Do not leave your knee unprotected until pain and stiffness go away (usually 4-6 weeks).  If you have a cast or splint, do not allow it to get wet. If you have been instructed not to remove it, cover it with a plastic bag when you shower or bathe. Do not swim.  Your health care provider may suggest exercises for you to do during your recovery to prevent or limit permanent weakness and stiffness. SEEK IMMEDIATE MEDICAL CARE IF:  Your cast or splint becomes damaged.  Your pain becomes worse.  You have significant pain, swelling, or numbness below the cast or splint. MAKE SURE YOU:  Understand these instructions.  Will watch your condition.  Will get help right away if you are not doing well or get worse. Document Released: 01/16/2005 Document Revised: 11/06/2012 Document Reviewed: 08/28/2012 [facility] Patient Information 2015 [place], [place]. This information is not intended to replace advice given to you by your health care provider. Make sure you discuss any questions you have with your health care provider.

## 2014-07-21 NOTE — ED Provider Notes (Signed)
CSN: [id number]     Arrival date & time 07/21/14  1328 History  This chart was scribed for non-physician practitioner, Jaynie Crumble, PA-C working with Raeford Razor, MD by Placido Sou, ED scribe. This patient was seen in room WTR5/WTR5 and the patient's care was started at 1:47 PM.    Chief Complaint  Patient presents with  . Knee Pain   The history is provided by the patient. No language interpreter was used.    HPI Comments: Darin Conner is a 59 y.o. male, with a history of bilateral knee pain and osteoarthritis, who presents to the Emergency Department by ambulance complaining of worsening, moderate, generalized, right knee pain with onset PTA. Pt notes stepping backwards off a step-ladder with a sharp pain immediately following. Pt notes a radiation of pain down his leg with additional swelling in his lower right leg and foot. He notes an exacerbation of symptoms when bearing weight or ambulating and denies icing the affected area or taking any medications PTA. He notes a history of knee pain and received Cortizone shots in his knees at both [facility] as well as with a Dr. Magnus Ivan this past year. Pt notes also having a colonoscopy scheduled for 07/24/2014. Pt denies any other associated symptoms.    Past Medical History  Diagnosis Date  . Hyperlipidemia   . Diabetes   . Erectile dysfunction   . Osteoarthritis   . Disturbance of skin sensation   . OSA on CPAP   . Hypertension   . Headache   . Dizziness    No past surgical history on file. Family History  Problem Relation Age of Onset  . Diabetes Father   . Prostate cancer Father   . Cancer Paternal Aunt   . Diabetes Sister    History  Substance Use Topics  . Smoking status: Former Games developer  . Smokeless tobacco: Never Used     Comment: 2003  . Alcohol Use: 0.0 oz/week     Comment: twice a week    Review of Systems  Constitutional: Negative for fever and chills.  Musculoskeletal: Positive for myalgias  and arthralgias.  Skin: Negative for wound.  Neurological: Positive for weakness.      Allergies  Penicillins  Home Medications   Prior to Admission medications   Medication Sig Start Date End Date Taking? Authorizing Provider  amLODipine (NORVASC) 5 MG tablet Take 5 mg by mouth daily.    Historical Provider, MD  aspirin 81 MG tablet Take 81 mg by mouth daily.    Historical Provider, MD  AZOR 5-40 MG per tablet Take 1 tablet by mouth daily.  02/04/14   Historical Provider, MD  Glucosamine-Chondroitin (COSAMIN DS PO) Take 1 capsule by mouth daily.    Historical Provider, MD  glucose blood test strip 1 each by Other route 2 (two) times daily. Use as instructed    Historical Provider, MD  INVOKANA 300 MG TABS tablet  02/22/14   Historical Provider, MD  JANUMET 50-1000 MG per tablet  03/05/14   Historical Provider, MD  Omega-3 Fatty Acids (OMEGA 3 PO) Take 1 capsule by mouth daily.    Historical Provider, MD  pravastatin (PRAVACHOL) 20 MG tablet Take 20 mg by mouth daily.    Historical Provider, MD  triamterene-hydrochlorothiazide (DYAZIDE) 37.5-25 MG per capsule Take 1 capsule by mouth daily.    Historical Provider, MD   BP 126/75 mmHg  Pulse 77  Temp(Src) 98.3 F (36.8 C) (Oral)  Resp 18  SpO2 100% Physical  Exam  Constitutional: He is oriented to person, place, and time. He appears well-developed and well-nourished. No distress.  HENT:  Head: Normocephalic and atraumatic.  Mouth/Throat: Oropharynx is clear and moist.  Eyes: Conjunctivae and EOM are normal. Pupils are equal, round, and reactive to light.  Neck: Normal range of motion. Neck supple. No tracheal deviation present.  Cardiovascular: Normal rate.   Pulmonary/Chest: Breath sounds normal. No respiratory distress.  Musculoskeletal:  Mild swelling noted to the right knee. Diffuse tenderness to palpation along both medial and lateral joint lines. No posterior knee tenderness. No tenderness over patellar anterior joint. Range of  motion, pain with full extension and full flexion. Unable to assess joint stability well because patient is unable to relax his knee due to pain. No laxity of the medial lateral stress. DP pulses intact. Normal ankle.   Neurological: He is alert and oriented to person, place, and time.  Skin: Skin is warm and dry.  Psychiatric: He has a normal mood and affect. His behavior is normal.  Nursing note and vitals reviewed.   ED Course  Procedures  DIAGNOSTIC STUDIES: Oxygen Saturation is 100% on RA, normal by my interpretation.    COORDINATION OF CARE: 1:51 PM Discussed treatment plan including a knee x-ray and a vicodine with pt at bedside and pt agreed to plan.  Labs Review Labs Reviewed - No data to display  Imaging Review Dg Knee Complete 4 Views Right  07/21/2014   CLINICAL DATA:  Right knee injury while working.  Initial encounter.  EXAM: RIGHT KNEE - COMPLETE 4+ VIEW  COMPARISON:  None.  FINDINGS: Small knee joint effusion. Mild posterior subluxation of the tibial plateau relative to the femoral condyles. No evidence of acute fracture or dislocation.  Knee osteoarthritis with advanced medial compartment narrowing, near bone-on-bone contact, with marginal spurring.  IMPRESSION: 1. Posterior subluxation of the tibial plateau relative to the distal femur. Correlate with cruciate exam. 2. Small knee joint effusion. 3. No acute fracture. 4. Tricompartmental osteoarthritis with advanced medial compartment narrowing.   Electronically Signed   By: Marnee Spring M.D.   On: 07/21/2014 14:22     EKG Interpretation None      MDM   Final diagnoses:  Right knee injury, initial encounter    Patient with a right knee injury after stepping off of the ladder. Pain with bearing weight, range of motion. X-ray concerning for cruciate ligament injury. I'm unable to perform a good physical exam because of patient's pain and inability to relax the joint fully. We'll place an immobilizer, crutches,  follow-up with orthopedic specialist. Patient states he has seen Dr. Magnus Ivan for this knee in the past because of arthritis, and would like to follow-up with him. Ibuprofen and Norco for pain.  Filed Vitals:   07/21/14 1335 07/21/14 1338  BP: 126/75   Pulse: 77   Temp: 98.3 F (36.8 C)   TempSrc: Oral   Resp: 18   SpO2: 98% 100%    I personally performed the services described in this documentation, which was scribed in my presence. The recorded information has been reviewed and is accurate.    Jaynie Crumble, PA-C 07/21/14 1440  Raeford Razor, MD 07/22/14 [id number]

## 2014-10-22 ENCOUNTER — Ambulatory Visit (INDEPENDENT_AMBULATORY_CARE_PROVIDER_SITE_OTHER): Payer: Worker's Compensation | Admitting: Emergency Medicine

## 2014-10-22 VITALS — BP 126/80 | HR 69 | Temp 97.8°F | Resp 16 | Ht 71.0 in | Wt 262.0 lb

## 2014-10-22 DIAGNOSIS — S61312A Laceration without foreign body of right middle finger with damage to nail, initial encounter: Secondary | ICD-10-CM | POA: Diagnosis not present

## 2014-10-22 DIAGNOSIS — Z23 Encounter for immunization: Secondary | ICD-10-CM | POA: Diagnosis not present

## 2014-10-22 NOTE — Patient Instructions (Addendum)
Laceration Care, Adult A laceration is a cut or lesion that goes through all layers of the skin and into the tissue just beneath the skin. TREATMENT  Some lacerations may not require closure. Some lacerations may not be able to be closed due to an increased risk of infection. It is important to see your caregiver as soon as possible after an injury to minimize the risk of infection and maximize the opportunity for successful closure. If closure is appropriate, pain medicines may be given, if needed. The wound will be cleaned to help prevent infection. Your caregiver will use stitches (sutures), staples, wound glue (adhesive), or skin adhesive strips to repair the laceration. These tools bring the skin edges together to allow for faster healing and a better cosmetic outcome. However, all wounds will heal with a scar. Once the wound has healed, scarring can be minimized by covering the wound with sunscreen during the day for 1 full year. HOME CARE INSTRUCTIONS  For sutures or staples:  Keep the wound clean and dry.  If you were given a bandage (dressing), you should change it at least once a day. Also, change the dressing if it becomes wet or dirty, or as directed by your caregiver.  Wash the wound with soap and water 2 times a day. Rinse the wound off with water to remove all soap. Pat the wound dry with a clean towel.  After cleaning, apply a thin layer of the antibiotic ointment as recommended by your caregiver. This will help prevent infection and keep the dressing from sticking.  You may shower as usual after the first 24 hours. Do not soak the wound in water until the sutures are removed.  Only take over-the-counter or prescription medicines for pain, discomfort, or fever as directed by your caregiver.  Get your sutures or staples removed as directed by your caregiver. For skin adhesive strips:  Keep the wound clean and dry.  Do not get the skin adhesive strips wet. You may bathe  carefully, using caution to keep the wound dry.  If the wound gets wet, pat it dry with a clean towel.  Skin adhesive strips will fall off on their own. You may trim the strips as the wound heals. Do not remove skin adhesive strips that are still stuck to the wound. They will fall off in time. For wound adhesive:  You may briefly wet your wound in the shower or bath. Do not soak or scrub the wound. Do not swim. Avoid periods of heavy perspiration until the skin adhesive has fallen off on its own. After showering or bathing, gently pat the wound dry with a clean towel.  Do not apply liquid medicine, cream medicine, or ointment medicine to your wound while the skin adhesive is in place. This may loosen the film before your wound is healed.  If a dressing is placed over the wound, be careful not to apply tape directly over the skin adhesive. This may cause the adhesive to be pulled off before the wound is healed.  Avoid prolonged exposure to sunlight or tanning lamps while the skin adhesive is in place. Exposure to ultraviolet light in the first year will darken the scar.  The skin adhesive will usually remain in place for 5 to 10 days, then naturally fall off the skin. Do not pick at the adhesive film. You may need a tetanus shot if:  You cannot remember when you had your last tetanus shot.  You have never had a tetanus   shot. If you get a tetanus shot, your arm may swell, get red, and feel warm to the touch. This is common and not a problem. If you need a tetanus shot and you choose not to have one, there is a rare chance of getting tetanus. Sickness from tetanus can be serious. SEEK MEDICAL CARE IF:   You have redness, swelling, or increasing pain in the wound.  You see a red line that goes away from the wound.  You have yellowish-white fluid (pus) coming from the wound.  You have a fever.  You notice a bad smell coming from the wound or dressing.  Your wound breaks open before or  after sutures have been removed.  You notice something coming out of the wound such as wood or glass.  Your wound is on your hand or foot and you cannot move a finger or toe. SEEK IMMEDIATE MEDICAL CARE IF:   Your pain is not controlled with prescribed medicine.  You have severe swelling around the wound causing pain and numbness or a change in color in your arm, hand, leg, or foot.  Your wound splits open and starts bleeding.  You have worsening numbness, weakness, or loss of function of any joint around or beyond the wound.  You develop painful lumps near the wound or on the skin anywhere on your body. MAKE SURE YOU:   Understand these instructions.  Will watch your condition.  Will get help right away if you are not doing well or get worse. Document Released: 01/16/2005 Document Revised: 04/10/2011 Document Reviewed: 07/12/2010 ExitCare Patient Information 2015 [employer]. This information is not intended to replace advice given to you by your health care Darin Conner. Make sure you discuss any questions you have with your health care Darin Conner.  Tdap Vaccine (Tetanus, Diphtheria, Pertussis): What You Need to Know 1. Why get vaccinated? Tetanus, diphtheria and pertussis can be very serious diseases, even for adolescents and adults. Tdap vaccine can protect us from these diseases. TETANUS (Lockjaw) causes painful muscle tightening and stiffness, usually all over the body.  It can lead to tightening of muscles in the head and neck so you can't open your mouth, swallow, or sometimes even breathe. Tetanus kills about 1 out of 5 people who are infected. DIPHTHERIA can cause a thick coating to form in the back of the throat.  It can lead to breathing problems, paralysis, heart failure, and death. PERTUSSIS (Whooping Cough) causes severe coughing spells, which can cause difficulty breathing, vomiting and disturbed sleep.  It can also lead to weight loss, incontinence, and rib  fractures. Up to 2 in 100 adolescents and 5 in 100 adults with pertussis are hospitalized or have complications, which could include pneumonia or death. These diseases are caused by bacteria. Diphtheria and pertussis are spread from person to person through coughing or sneezing. Tetanus enters the body through cuts, scratches, or wounds. Before vaccines, the [place] saw as many as 200,000 cases a year of diphtheria and pertussis, and hundreds of cases of tetanus. Since vaccination began, tetanus and diphtheria have dropped by about 99% and pertussis by about 80%. 2. Tdap vaccine Tdap vaccine can protect adolescents and adults from tetanus, diphtheria, and pertussis. One dose of Tdap is routinely given at age 11 or 12. People who did not get Tdap at that age should get it as soon as possible. Tdap is especially important for health care professionals and anyone having close contact with a baby younger than 12 months. Pregnant   women should get a dose of Tdap during every pregnancy, to protect the newborn from pertussis. Infants are most at risk for severe, life-threatening complications from pertussis. A similar vaccine, called Td, protects from tetanus and diphtheria, but not pertussis. A Td booster should be given every 10 years. Tdap may be given as one of these boosters if you have not already gotten a dose. Tdap may also be given after a severe cut or burn to prevent tetanus infection. Your doctor can give you more information. Tdap may safely be given at the same time as other vaccines. 3. Some people should not get this vaccine  If you ever had a life-threatening allergic reaction after a dose of any tetanus, diphtheria, or pertussis containing vaccine, OR if you have a severe allergy to any part of this vaccine, you should not get Tdap. Tell your doctor if you have any severe allergies.  If you had a coma, or long or multiple seizures within 7 days after a childhood dose of DTP or DTaP, you  should not get Tdap, unless a cause other than the vaccine was found. You can still get Td.  Talk to your doctor if you:  have epilepsy or another nervous system problem,  had severe pain or swelling after any vaccine containing diphtheria, tetanus or pertussis,  ever had Guillain-Barr Syndrome (GBS),  aren't feeling well on the day the shot is scheduled. 4. Risks of a vaccine reaction With any medicine, including vaccines, there is a chance of side effects. These are usually mild and go away on their own, but serious reactions are also possible. Brief fainting spells can follow a vaccination, leading to injuries from falling. Sitting or lying down for about 15 minutes can help prevent these. Tell your doctor if you feel dizzy or light-headed, or have vision changes or ringing in the ears. Mild problems following Tdap (Did not interfere with activities)  Pain where the shot was given (about 3 in 4 adolescents or 2 in 3 adults)  Redness or swelling where the shot was given (about 1 person in 5)  Mild fever of at least 100.4F (up to about 1 in 25 adolescents or 1 in 100 adults)  Headache (about 3 or 4 people in 10)  Tiredness (about 1 person in 3 or 4)  Nausea, vomiting, diarrhea, stomach ache (up to 1 in 4 adolescents or 1 in 10 adults)  Chills, body aches, sore joints, rash, swollen glands (uncommon) Moderate problems following Tdap (Interfered with activities, but did not require medical attention)  Pain where the shot was given (about 1 in 5 adolescents or 1 in 100 adults)  Redness or swelling where the shot was given (up to about 1 in 16 adolescents or 1 in 25 adults)  Fever over 102F (about 1 in 100 adolescents or 1 in 250 adults)  Headache (about 3 in 20 adolescents or 1 in 10 adults)  Nausea, vomiting, diarrhea, stomach ache (up to 1 or 3 people in 100)  Swelling of the entire arm where the shot was given (up to about 3 in 100). Severe problems following  Tdap (Unable to perform usual activities; required medical attention)  Swelling, severe pain, bleeding and redness in the arm where the shot was given (rare). A severe allergic reaction could occur after any vaccine (estimated less than 1 in a million doses). 5. What if there is a serious reaction? What should I look for?  Look for anything that concerns you, such as signs   of a severe allergic reaction, very high fever, or behavior changes. Signs of a severe allergic reaction can include hives, swelling of the face and throat, difficulty breathing, a fast heartbeat, dizziness, and weakness. These would start a few minutes to a few hours after the vaccination. What should I do?  If you think it is a severe allergic reaction or other emergency that can't wait, call 9-1-1 or get the person to the nearest hospital. Otherwise, call your doctor.  Afterward, the reaction should be reported to the "Vaccine Adverse Event Reporting System" (VAERS). Your doctor might file this report, or you can do it yourself through the VAERS web site at www.vaers.hhs.gov, or by calling 1-800-822-7967. VAERS is only for reporting reactions. They do not give medical advice.  6. The National Vaccine Injury Compensation Program The National Vaccine Injury Compensation Program (VICP) is a federal program that was created to compensate people who may have been injured by certain vaccines. Persons who believe they may have been injured by a vaccine can learn about the program and about filing a claim by calling 1-800-338-2382 or visiting the VICP website at www.hrsa.gov/vaccinecompensation. 7. How can I learn more?  Ask your doctor.  Call your local or state health department.  Contact the Centers for Disease Control and Prevention (CDC):  Call 1-800-232-4636 or visit CDC's website at www.cdc.gov/vaccines. CDC Tdap Vaccine VIS (06/08/11) Document Released: 07/18/2011 Document Revised: 06/02/2013 Document Reviewed:  04/30/2013 ExitCare Patient Information 2015 [employer]. This information is not intended to replace advice given to you by your health care Darin Conner. Make sure you discuss any questions you have with your health care Darin Conner.  

## 2014-10-22 NOTE — Progress Notes (Addendum)
   Subjective:  Patient ID: Darin Conner, male    DOB: 1955-10-14  Age: 59 y.o. MRN: [id number]  CC: Finger Injury   HPI Darin Conner presents  with an injury to the right middle finger. He has no other complaint. He is working on a piece of metal and cut his finger. He is not current on tetanus. He denies any other injury. He has moderate pain in his thumb.  History   Review of Systems  Constitutional: Negative for fever, chills and appetite change.  HENT: Negative for congestion, ear pain, postnasal drip, sinus pressure and sore throat.   Eyes: Negative for pain and redness.  Respiratory: Negative for cough, shortness of breath and wheezing.   Cardiovascular: Negative for leg swelling.  Gastrointestinal: Negative for nausea, vomiting, abdominal pain, diarrhea, constipation and blood in stool.  Endocrine: Negative for polyuria.  Genitourinary: Negative for dysuria, urgency, frequency and flank pain.  Musculoskeletal: Negative for gait problem.  Skin: Positive for wound. Negative for rash.  Neurological: Negative for weakness and headaches.  Psychiatric/Behavioral: Negative for confusion and decreased concentration. The patient is not nervous/anxious.     Objective:  BP 126/80 mmHg  Pulse 69  Temp(Src) 97.8 F (36.6 C) (Oral)  Resp 16  Ht  (1.803 m)  Wt 262 lb (118.842 kg)  BMI 36.56 kg/m2  SpO2 96%  Physical Exam  Constitutional: He is oriented to person, place, and time. He appears well-developed and well-nourished. No distress.  HENT:  Head: Normocephalic and atraumatic.  Right Ear: External ear normal.  Left Ear: External ear normal.  Nose: Nose normal.  Eyes: Conjunctivae and EOM are normal. Pupils are equal, round, and reactive to light. No scleral icterus.  Neck: Normal range of motion. Neck supple. No tracheal deviation present.  Cardiovascular: Normal rate, regular rhythm and normal heart sounds.   Pulmonary/Chest: Effort normal. No respiratory distress. He  has no wheezes. He has no rales.  Abdominal: He exhibits no mass. There is no tenderness. There is no rebound and no guarding.  Musculoskeletal: He exhibits no edema.  Lymphadenopathy:    He has no cervical adenopathy.  Neurological: He is alert and oriented to person, place, and time. Coordination normal.  Skin: Skin is warm and dry. No rash noted.  Psychiatric: He has a normal mood and affect. His behavior is normal.   Has a laceration of the terminal phalanx of the right middle finger. Wound is very superficial There is no evidence of foreign body or neurovascular tendon injury. No sutures are needed   Assessment & Plan:   Darin Conner was seen today for finger injury.  Diagnoses and all orders for this visit:  Laceration of right middle finger w/o foreign body with damage to nail, initial encounter  Need for Tdap vaccination  Other orders -     Tdap vaccine greater than or equal to 7yo IM   I am having Darin Conner maintain his glucose blood, pravastatin, triamterene-hydrochlorothiazide, aspirin, Omega-3 Fatty Acids (OMEGA 3 PO), Glucosamine-Chondroitin (COSAMIN DS PO), amLODipine, AZOR, INVOKANA, JANUMET, HYDROcodone-acetaminophen, ibuprofen, and acetaminophen-codeine.  Meds ordered this encounter  Medications  . acetaminophen-codeine (TYLENOL #3) 300-30 MG per tablet    Sig: Take by mouth every 4 (four) hours as needed for moderate pain.    Appropriate red flag conditions were discussed with the patient as well as actions that should be taken.  Patient expressed his understanding.  Follow-up: Return if symptoms worsen or fail to improve.  Carmelina Dane, MD

## 2014-12-11 ENCOUNTER — Other Ambulatory Visit: Payer: Self-pay | Admitting: Orthopedic Surgery

## 2015-01-04 ENCOUNTER — Encounter (HOSPITAL_COMMUNITY)
Admission: RE | Admit: 2015-01-04 | Discharge: 2015-01-04 | Disposition: A | Discharge status: Home / Self Care | Payer: BLUE CROSS/BLUE SHIELD | Source: Ambulatory Visit | Attending: Orthopedic Surgery | Admitting: Orthopedic Surgery

## 2015-01-04 ENCOUNTER — Encounter (HOSPITAL_COMMUNITY): Payer: Self-pay

## 2015-01-04 ENCOUNTER — Ambulatory Visit (HOSPITAL_COMMUNITY)
Admission: RE | Admit: 2015-01-04 | Discharge: 2015-01-04 | Disposition: A | Discharge status: Home / Self Care | Payer: BLUE CROSS/BLUE SHIELD | Source: Ambulatory Visit | Attending: Orthopedic Surgery | Admitting: Orthopedic Surgery

## 2015-01-04 DIAGNOSIS — I10 Essential (primary) hypertension: Secondary | ICD-10-CM | POA: Diagnosis not present

## 2015-01-04 DIAGNOSIS — Z01810 Encounter for preprocedural cardiovascular examination: Secondary | ICD-10-CM | POA: Diagnosis not present

## 2015-01-04 DIAGNOSIS — Z01812 Encounter for preprocedural laboratory examination: Secondary | ICD-10-CM | POA: Insufficient documentation

## 2015-01-04 DIAGNOSIS — Z01818 Encounter for other preprocedural examination: Secondary | ICD-10-CM | POA: Insufficient documentation

## 2015-01-04 LAB — URINALYSIS, ROUTINE W REFLEX MICROSCOPIC
BILIRUBIN URINE: NEGATIVE
GLUCOSE, UA: NEGATIVE mg/dL
HGB URINE DIPSTICK: NEGATIVE
Ketones, ur: NEGATIVE mg/dL
Leukocytes, UA: NEGATIVE
Nitrite: NEGATIVE
PROTEIN: NEGATIVE mg/dL
Specific Gravity, Urine: 1.012 (ref 1.005–1.030)
pH: 6 (ref 5.0–8.0)

## 2015-01-04 LAB — SURGICAL PCR SCREEN
MRSA, PCR: NEGATIVE
STAPHYLOCOCCUS AUREUS: NEGATIVE

## 2015-01-04 LAB — BASIC METABOLIC PANEL
ANION GAP: 8 (ref 5–15)
BUN: 18 mg/dL (ref 6–20)
CO2: 24 mmol/L (ref 22–32)
Calcium: 9.7 mg/dL (ref 8.9–10.3)
Chloride: 103 mmol/L (ref 101–111)
Creatinine, Ser: 1.22 mg/dL (ref 0.61–1.24)
GLUCOSE: 198 mg/dL — AB (ref 65–99)
POTASSIUM: 3.6 mmol/L (ref 3.5–5.1)
Sodium: 135 mmol/L (ref 135–145)

## 2015-01-04 LAB — CBC WITH DIFFERENTIAL/PLATELET
BASOS ABS: 0 10*3/uL (ref 0.0–0.1)
BASOS PCT: 1 %
Eosinophils Absolute: 0.1 10*3/uL (ref 0.0–0.7)
Eosinophils Relative: 2 %
HEMATOCRIT: 41.3 % (ref 39.0–52.0)
HEMOGLOBIN: 14.1 g/dL (ref 13.0–17.0)
LYMPHS PCT: 12 %
Lymphs Abs: 0.9 10*3/uL (ref 0.7–4.0)
MCH: 27.7 pg (ref 26.0–34.0)
MCHC: 34.1 g/dL (ref 30.0–36.0)
MCV: 81.1 fL (ref 78.0–100.0)
MONO ABS: 0.6 10*3/uL (ref 0.1–1.0)
Monocytes Relative: 7 %
NEUTROS ABS: 5.9 10*3/uL (ref 1.7–7.7)
NEUTROS PCT: 78 %
Platelets: 156 10*3/uL (ref 150–400)
RBC: 5.09 MIL/uL (ref 4.22–5.81)
RDW: 12.3 % (ref 11.5–15.5)
WBC: 7.5 10*3/uL (ref 4.0–10.5)

## 2015-01-04 LAB — GLUCOSE, CAPILLARY: GLUCOSE-CAPILLARY: 186 mg/dL — AB (ref 65–99)

## 2015-01-04 LAB — PROTIME-INR
INR: 1.07 (ref 0.00–1.49)
Prothrombin Time: 14.1 seconds (ref 11.6–15.2)

## 2015-01-04 LAB — TYPE AND SCREEN
ABO/RH(D): O NEG
ANTIBODY SCREEN: NEGATIVE

## 2015-01-04 LAB — ABO/RH: ABO/RH(D): O NEG

## 2015-01-04 LAB — APTT: APTT: 26 s (ref 24–37)

## 2015-01-04 NOTE — Pre-Procedure Instructions (Signed)
    Jameison Mangine  01/04/2015      [id number] [place] - [address] [place] [id number] Phone: [phone] Fax: [phone]  [facility] [place] - [address] [place] [id number] Phone: [phone] Fax: [phone]    Your procedure is scheduled on 01/13/15.  [facility] at 1045 A.M.  Call this number if you have problems the morning of surgery:  [phone]   Remember:  Do not eat food or drink liquids after midnight.  Take these medicines the morning of surgery with A SIP OF WATER --amlodipine,hydrocodone   Do not wear jewelry, make-up or nail polish.  Do not wear lotions, powders, or perfumes.  You may wear deodorant.  Do not shave 48 hours prior to surgery.  Men may shave face and neck.  Do not bring valuables to the hospital.  [facility] is not responsible for any belongings or valuables.  Contacts, dentures or bridgework may not be worn into surgery.  Leave your suitcase in the car.  After surgery it may be brought to your room.  For patients admitted to the hospital, discharge time will be determined by your treatment team.  Patients discharged the day of surgery will not be allowed to drive home.   Name and phone number of your driver:    Special instructions:    Please read over the following fact sheets that you were given. Pain Booklet, Coughing and Deep Breathing, Blood Transfusion Information, MRSA Information and Surgical Site Infection Prevention

## 2015-01-05 LAB — HEMOGLOBIN A1C
HEMOGLOBIN A1C: 7 % — AB (ref 4.8–5.6)
Mean Plasma Glucose: 154 mg/dL

## 2015-01-05 NOTE — Progress Notes (Signed)
Anesthesia Chart Review:  Pt is a 59 year old male scheduled for R total knee arthroplasty on 01/13/2015 with Dr. Turner Danielsowan.   PMH includes:  HTN, DM, hyperlipidemia, OSA (on CPAP). Former smoker. BMI 36.   Medications include: amlodipine-olmesartan, ASA, janumet, pravastatin, cialis, triamterene-hctz.   Preoperative labs reviewed.  Glucose 198, hgbA1c 7.0.   Chest x-ray 01/04/15 reviewed. Probable nipple shadow on the left; advise repeat study with nipple markers to confirm. No edema or consolidation. Left voicemail for Olegario MessierKathy in Dr. Wadie Lessenowan's office with this result.   EKG 01/04/15: NSR. Nonspecific T wave abnormality  If no changes, I anticipate pt can proceed with surgery as scheduled.   Rica Mastngela Castle Lamons, FNP-BC [facility]/Anesthesiology Phone: [phone] 01/05/2015 1:48 PM

## 2015-01-12 MED ORDER — VANCOMYCIN HCL 10 G IV SOLR
1500.0000 mg | INTRAVENOUS | Status: AC
  Administered 2015-01-13: 1500 mg via INTRAVENOUS
  Filled 2015-01-12: qty 1500

## 2015-01-12 NOTE — H&P (Signed)
TOTAL KNEE ADMISSION H&P  Patient is being admitted for right total knee arthroplasty.  Subjective:  Chief Complaint:right knee pain.  HPI: Darin Conner, 59 y.o. male, has a history of pain and functional disability in the right knee due to arthritis and has failed non-surgical conservative treatments for greater than 12 weeks to includeNSAID's and/or analgesics, corticosteriod injections, viscosupplementation injections, flexibility and strengthening excercises, weight reduction as appropriate and activity modification.  Onset of symptoms was gradual, starting 2 years ago with gradually worsening course since that time. The patient noted no past surgery on the right knee(s).  Patient currently rates pain in the right knee(s) at 10 out of 10 with activity. Patient has night pain, worsening of pain with activity and weight bearing, pain that interferes with activities of daily living, pain with passive range of motion, crepitus and joint swelling.  Patient has evidence of joint space narrowing by imaging studies.  There is no active infection.  Patient Active Problem List   Diagnosis Date Noted  . Fatigue due to sleep pattern disturbance 03/12/2014  . Nocturia more than twice per night 03/12/2014  . DM (diabetes mellitus) type 2, uncontrolled, with ketoacidosis (HCC) 03/12/2014  . OSA on CPAP 03/12/2013   Past Medical History  Diagnosis Date  . Hyperlipidemia   . Diabetes (HCC)   . Erectile dysfunction   . Osteoarthritis   . Disturbance of skin sensation   . OSA on CPAP   . Hypertension   . Headache   . Dizziness     Past Surgical History  Procedure Laterality Date  . No past surgeries      No prescriptions prior to admission   Allergies  Allergen Reactions  . Penicillins     Social History  Substance Use Topics  . Smoking status: Former Games developermoker  . Smokeless tobacco: Never Used     Comment: 2003  . Alcohol Use: 0.0 oz/week     Comment: twice a week    Family History   Problem Relation Age of Onset  . Diabetes Father   . Prostate cancer Father   . Cancer Paternal Aunt   . Diabetes Sister      Review of Systems  Constitutional: Positive for fever, chills and diaphoresis.  HENT: Negative.   Eyes: Negative.   Respiratory: Negative.   Cardiovascular: Negative.   Gastrointestinal: Negative.   Genitourinary: Negative.   Musculoskeletal: Positive for joint pain.       RA  Skin: Negative.   Neurological: Negative.   Endo/Heme/Allergies:       Blood sugar problem  Psychiatric/Behavioral: The patient is nervous/anxious.     Objective:  Physical Exam  Constitutional: He is oriented to person, place, and time. He appears well-developed and well-nourished.  HENT:  Head: Normocephalic and atraumatic.  Eyes: Pupils are equal, round, and reactive to light.  Neck: Normal range of motion. Neck supple.  Cardiovascular: Intact distal pulses.   Respiratory: Effort normal.  Musculoskeletal:  Both knees have 5-10 flexion contractures and flexion is limited to 120 bilaterally.  Tender along the medial joint line, right greater than left knee.  Obvious varus deformity to right greater than left knee.  Normal sensation of the feet.  Skin is intact.  Neurologically he is intact.  Neurological: He is alert and oriented to person, place, and time.  Skin: Skin is warm and dry.  Psychiatric: He has a normal mood and affect. His behavior is normal. Judgment and thought content normal.    Vital signs  in last 24 hours:    Labs:   Estimated body mass index is 36.56 kg/(m^2) as calculated from the following:   Height as of 10/22/14:  (1.803 m).   Weight as of 10/22/14: 118.842 kg (262 lb).   Imaging Review Plain radiographs demonstrate bone-on-bone arthritic changes to the medial compartment of the right knee.   Assessment/Plan:  End stage arthritis, right knee   The patient history, physical examination, clinical judgment of the provider and imaging  studies are consistent with end stage degenerative joint disease of the right knee(s) and total knee arthroplasty is deemed medically necessary. The treatment options including medical management, injection therapy arthroscopy and arthroplasty were discussed at length. The risks and benefits of total knee arthroplasty were presented and reviewed. The risks due to aseptic loosening, infection, stiffness, patella tracking problems, thromboembolic complications and other imponderables were discussed. The patient acknowledged the explanation, agreed to proceed with the plan and consent was signed. Patient is being admitted for inpatient treatment for surgery, pain control, PT, OT, prophylactic antibiotics, VTE prophylaxis, progressive ambulation and ADL's and discharge planning. The patient is planning to be discharged home with home health services

## 2015-01-13 ENCOUNTER — Encounter (HOSPITAL_COMMUNITY)
Admission: RE | Disposition: A | Discharge status: Home Health | Payer: Self-pay | Source: Ambulatory Visit | Attending: Orthopedic Surgery

## 2015-01-13 ENCOUNTER — Encounter (HOSPITAL_COMMUNITY): Payer: Self-pay | Admitting: Surgery

## 2015-01-13 ENCOUNTER — Inpatient Hospital Stay (HOSPITAL_COMMUNITY)
Admission: RE | Admit: 2015-01-13 | Discharge: 2015-01-15 | DRG: 470 | Disposition: A | Discharge status: Home Health | Payer: BLUE CROSS/BLUE SHIELD | Source: Ambulatory Visit | Attending: Orthopedic Surgery | Admitting: Orthopedic Surgery

## 2015-01-13 ENCOUNTER — Inpatient Hospital Stay (HOSPITAL_COMMUNITY): Payer: BLUE CROSS/BLUE SHIELD | Admitting: Emergency Medicine

## 2015-01-13 ENCOUNTER — Inpatient Hospital Stay (HOSPITAL_COMMUNITY): Payer: BLUE CROSS/BLUE SHIELD

## 2015-01-13 ENCOUNTER — Inpatient Hospital Stay (HOSPITAL_COMMUNITY): Payer: BLUE CROSS/BLUE SHIELD | Admitting: Certified Registered Nurse Anesthetist

## 2015-01-13 DIAGNOSIS — Z7982 Long term (current) use of aspirin: Secondary | ICD-10-CM | POA: Diagnosis not present

## 2015-01-13 DIAGNOSIS — M1710 Unilateral primary osteoarthritis, unspecified knee: Secondary | ICD-10-CM | POA: Diagnosis present

## 2015-01-13 DIAGNOSIS — D62 Acute posthemorrhagic anemia: Secondary | ICD-10-CM | POA: Diagnosis not present

## 2015-01-13 DIAGNOSIS — Z833 Family history of diabetes mellitus: Secondary | ICD-10-CM

## 2015-01-13 DIAGNOSIS — G4733 Obstructive sleep apnea (adult) (pediatric): Secondary | ICD-10-CM | POA: Diagnosis present

## 2015-01-13 DIAGNOSIS — Z7984 Long term (current) use of oral hypoglycemic drugs: Secondary | ICD-10-CM | POA: Diagnosis not present

## 2015-01-13 DIAGNOSIS — M1711 Unilateral primary osteoarthritis, right knee: Secondary | ICD-10-CM | POA: Diagnosis present

## 2015-01-13 DIAGNOSIS — E785 Hyperlipidemia, unspecified: Secondary | ICD-10-CM | POA: Diagnosis present

## 2015-01-13 DIAGNOSIS — I10 Essential (primary) hypertension: Secondary | ICD-10-CM | POA: Diagnosis present

## 2015-01-13 DIAGNOSIS — Z880 Allergy status to penicillin: Secondary | ICD-10-CM | POA: Diagnosis not present

## 2015-01-13 DIAGNOSIS — Z01811 Encounter for preprocedural respiratory examination: Secondary | ICD-10-CM

## 2015-01-13 DIAGNOSIS — E119 Type 2 diabetes mellitus without complications: Secondary | ICD-10-CM | POA: Diagnosis present

## 2015-01-13 DIAGNOSIS — Z87891 Personal history of nicotine dependence: Secondary | ICD-10-CM | POA: Diagnosis not present

## 2015-01-13 DIAGNOSIS — Z8042 Family history of malignant neoplasm of prostate: Secondary | ICD-10-CM

## 2015-01-13 HISTORY — PX: TOTAL KNEE ARTHROPLASTY: SHX125

## 2015-01-13 LAB — GLUCOSE, CAPILLARY
GLUCOSE-CAPILLARY: 166 mg/dL — AB (ref 65–99)
GLUCOSE-CAPILLARY: 146 mg/dL — AB (ref 65–99)
GLUCOSE-CAPILLARY: 286 mg/dL — AB (ref 65–99)
Glucose-Capillary: 195 mg/dL — ABNORMAL HIGH (ref 65–99)

## 2015-01-13 SURGERY — ARTHROPLASTY, KNEE, TOTAL
Anesthesia: Monitor Anesthesia Care | Site: Knee | Laterality: Right

## 2015-01-13 MED ORDER — MIDAZOLAM HCL 2 MG/2ML IJ SOLN
INTRAMUSCULAR | Status: AC
  Filled 2015-01-13: qty 2

## 2015-01-13 MED ORDER — BISACODYL 5 MG PO TBEC: 5.0000 mg | DELAYED_RELEASE_TABLET | Freq: Every day | ORAL | Status: DC | PRN

## 2015-01-13 MED ORDER — LIDOCAINE HCL (CARDIAC) 20 MG/ML IV SOLN
INTRAVENOUS | Status: AC
  Filled 2015-01-13: qty 5

## 2015-01-13 MED ORDER — TRIAMTERENE-HCTZ 37.5-25 MG PO TABS
1.0000 | ORAL_TABLET | Freq: Every day | ORAL | Status: DC
  Administered 2015-01-14 – 2015-01-15 (×2): 1 via ORAL
  Filled 2015-01-13 (×2): qty 1

## 2015-01-13 MED ORDER — METHOCARBAMOL 1000 MG/10ML IJ SOLN
500.0000 mg | Freq: Four times a day (QID) | INTRAVENOUS | Status: DC | PRN
  Filled 2015-01-13: qty 5

## 2015-01-13 MED ORDER — IRBESARTAN 300 MG PO TABS
300.0000 mg | ORAL_TABLET | Freq: Every day | ORAL | Status: DC
  Administered 2015-01-14 – 2015-01-15 (×2): 300 mg via ORAL
  Filled 2015-01-13 (×2): qty 1

## 2015-01-13 MED ORDER — ACETAMINOPHEN 160 MG/5ML PO SOLN: 325.0000 mg | ORAL | Status: DC | PRN

## 2015-01-13 MED ORDER — SODIUM CHLORIDE 0.9 % IJ SOLN
INTRAMUSCULAR | Status: DC | PRN
  Administered 2015-01-13: 20 mL

## 2015-01-13 MED ORDER — ACETAMINOPHEN 650 MG RE SUPP: 650.0000 mg | Freq: Four times a day (QID) | RECTAL | Status: DC | PRN

## 2015-01-13 MED ORDER — PROPOFOL 10 MG/ML IV BOLUS
INTRAVENOUS | Status: AC
  Filled 2015-01-13: qty 20

## 2015-01-13 MED ORDER — ONDANSETRON HCL 4 MG/2ML IJ SOLN
INTRAMUSCULAR | Status: DC | PRN
  Administered 2015-01-13: 4 mg via INTRAVENOUS

## 2015-01-13 MED ORDER — 0.9 % SODIUM CHLORIDE (POUR BTL) OPTIME
TOPICAL | Status: DC | PRN
  Administered 2015-01-13: 1000 mL

## 2015-01-13 MED ORDER — DEXTROSE-NACL 5-0.45 % IV SOLN: INTRAVENOUS | Status: DC

## 2015-01-13 MED ORDER — EPHEDRINE SULFATE 50 MG/ML IJ SOLN
INTRAMUSCULAR | Status: DC | PRN
  Administered 2015-01-13 (×2): 5 mg via INTRAVENOUS
  Administered 2015-01-13: 15 mg via INTRAVENOUS

## 2015-01-13 MED ORDER — FENTANYL CITRATE (PF) 250 MCG/5ML IJ SOLN
INTRAMUSCULAR | Status: AC
  Filled 2015-01-13: qty 5

## 2015-01-13 MED ORDER — METOCLOPRAMIDE HCL 5 MG/ML IJ SOLN: 5.0000 mg | Freq: Three times a day (TID) | INTRAMUSCULAR | Status: DC | PRN

## 2015-01-13 MED ORDER — LACTATED RINGERS IV SOLN
INTRAVENOUS | Status: DC
  Administered 2015-01-13 (×3): via INTRAVENOUS

## 2015-01-13 MED ORDER — TRIAMTERENE-HCTZ 37.5-25 MG PO CAPS: 1.0000 | ORAL_CAPSULE | Freq: Every day | ORAL | Status: DC

## 2015-01-13 MED ORDER — PHENOL 1.4 % MT LIQD: 1.0000 | OROMUCOSAL | Status: DC | PRN

## 2015-01-13 MED ORDER — ALBUMIN HUMAN 5 % IV SOLN
INTRAVENOUS | Status: DC | PRN
  Administered 2015-01-13: 14:00:00 via INTRAVENOUS

## 2015-01-13 MED ORDER — SENNOSIDES-DOCUSATE SODIUM 8.6-50 MG PO TABS: 1.0000 | ORAL_TABLET | Freq: Every evening | ORAL | Status: DC | PRN

## 2015-01-13 MED ORDER — ONDANSETRON HCL 4 MG/2ML IJ SOLN
INTRAMUSCULAR | Status: AC
  Filled 2015-01-13: qty 2

## 2015-01-13 MED ORDER — METHOCARBAMOL 500 MG PO TABS: 500.0000 mg | ORAL_TABLET | Freq: Two times a day (BID) | ORAL | Status: DC

## 2015-01-13 MED ORDER — CHLORHEXIDINE GLUCONATE 4 % EX LIQD: 60.0000 mL | Freq: Once | CUTANEOUS | Status: DC

## 2015-01-13 MED ORDER — OXYCODONE HCL 5 MG PO TABS: 5.0000 mg | ORAL_TABLET | Freq: Once | ORAL | Status: DC | PRN

## 2015-01-13 MED ORDER — GLYCOPYRROLATE 0.2 MG/ML IJ SOLN
INTRAMUSCULAR | Status: AC
  Filled 2015-01-13: qty 3

## 2015-01-13 MED ORDER — BUPIVACAINE LIPOSOME 1.3 % IJ SUSP
20.0000 mL | Freq: Once | INTRAMUSCULAR | Status: AC
  Administered 2015-01-13: 20 mL
  Filled 2015-01-13: qty 20

## 2015-01-13 MED ORDER — PHENYLEPHRINE 40 MCG/ML (10ML) SYRINGE FOR IV PUSH (FOR BLOOD PRESSURE SUPPORT)
PREFILLED_SYRINGE | INTRAVENOUS | Status: AC
  Filled 2015-01-13: qty 10

## 2015-01-13 MED ORDER — ACETAMINOPHEN 325 MG PO TABS: 650.0000 mg | ORAL_TABLET | Freq: Four times a day (QID) | ORAL | Status: DC | PRN

## 2015-01-13 MED ORDER — DOCUSATE SODIUM 100 MG PO CAPS
100.0000 mg | ORAL_CAPSULE | Freq: Two times a day (BID) | ORAL | Status: DC
  Administered 2015-01-13 – 2015-01-15 (×4): 100 mg via ORAL
  Filled 2015-01-13 (×4): qty 1

## 2015-01-13 MED ORDER — MENTHOL 3 MG MT LOZG: 1.0000 | LOZENGE | OROMUCOSAL | Status: DC | PRN

## 2015-01-13 MED ORDER — PROPOFOL 500 MG/50ML IV EMUL
INTRAVENOUS | Status: DC | PRN
  Administered 2015-01-13: 75 ug/kg/min via INTRAVENOUS

## 2015-01-13 MED ORDER — FLEET ENEMA 7-19 GM/118ML RE ENEM: 1.0000 | ENEMA | Freq: Once | RECTAL | Status: DC | PRN

## 2015-01-13 MED ORDER — NEOSTIGMINE METHYLSULFATE 10 MG/10ML IV SOLN
INTRAVENOUS | Status: AC
  Filled 2015-01-13: qty 1

## 2015-01-13 MED ORDER — BUPIVACAINE HCL (PF) 0.5 % IJ SOLN
INTRAMUSCULAR | Status: DC | PRN
  Administered 2015-01-13: 20 mL

## 2015-01-13 MED ORDER — ALUM & MAG HYDROXIDE-SIMETH 200-200-20 MG/5ML PO SUSP: 30.0000 mL | ORAL | Status: DC | PRN

## 2015-01-13 MED ORDER — ONDANSETRON HCL 4 MG PO TABS: 4.0000 mg | ORAL_TABLET | Freq: Four times a day (QID) | ORAL | Status: DC | PRN

## 2015-01-13 MED ORDER — ASPIRIN EC 325 MG PO TBEC
325.0000 mg | DELAYED_RELEASE_TABLET | Freq: Every day | ORAL | Status: DC
  Administered 2015-01-14 – 2015-01-15 (×2): 325 mg via ORAL
  Filled 2015-01-13 (×2): qty 1

## 2015-01-13 MED ORDER — ASPIRIN EC 325 MG PO TBEC: 325.0000 mg | DELAYED_RELEASE_TABLET | Freq: Two times a day (BID) | ORAL | Status: DC

## 2015-01-13 MED ORDER — AMLODIPINE BESYLATE 5 MG PO TABS
5.0000 mg | ORAL_TABLET | Freq: Every day | ORAL | Status: DC
  Administered 2015-01-14 – 2015-01-15 (×2): 5 mg via ORAL
  Filled 2015-01-13 (×2): qty 1

## 2015-01-13 MED ORDER — OXYCODONE HCL 5 MG PO TABS
5.0000 mg | ORAL_TABLET | ORAL | Status: DC | PRN
  Administered 2015-01-13 – 2015-01-15 (×10): 10 mg via ORAL
  Filled 2015-01-13 (×12): qty 2

## 2015-01-13 MED ORDER — HYDROMORPHONE HCL 1 MG/ML IJ SOLN
1.0000 mg | INTRAMUSCULAR | Status: DC | PRN
  Administered 2015-01-14 (×3): 1 mg via INTRAVENOUS
  Filled 2015-01-13 (×3): qty 1

## 2015-01-13 MED ORDER — OXYCODONE HCL 5 MG/5ML PO SOLN: 5.0000 mg | Freq: Once | ORAL | Status: DC | PRN

## 2015-01-13 MED ORDER — SODIUM CHLORIDE 0.9 % IV SOLN
1000.0000 mg | INTRAVENOUS | Status: AC
  Administered 2015-01-13: 1000 mg via INTRAVENOUS
  Filled 2015-01-13: qty 10

## 2015-01-13 MED ORDER — BUPIVACAINE HCL (PF) 0.5 % IJ SOLN
INTRAMUSCULAR | Status: AC
  Filled 2015-01-13: qty 30

## 2015-01-13 MED ORDER — FENTANYL CITRATE (PF) 100 MCG/2ML IJ SOLN
INTRAMUSCULAR | Status: DC | PRN
  Administered 2015-01-13: 50 ug via INTRAVENOUS

## 2015-01-13 MED ORDER — ONDANSETRON HCL 4 MG/2ML IJ SOLN: 4.0000 mg | Freq: Four times a day (QID) | INTRAMUSCULAR | Status: DC | PRN

## 2015-01-13 MED ORDER — OXYCODONE-ACETAMINOPHEN 5-325 MG PO TABS: 1.0000 | ORAL_TABLET | ORAL | Status: DC | PRN

## 2015-01-13 MED ORDER — METOCLOPRAMIDE HCL 5 MG PO TABS: 5.0000 mg | ORAL_TABLET | Freq: Three times a day (TID) | ORAL | Status: DC | PRN

## 2015-01-13 MED ORDER — FENTANYL CITRATE (PF) 100 MCG/2ML IJ SOLN
INTRAMUSCULAR | Status: AC
  Filled 2015-01-13: qty 2

## 2015-01-13 MED ORDER — KCL IN DEXTROSE-NACL 20-5-0.45 MEQ/L-%-% IV SOLN
INTRAVENOUS | Status: DC
  Administered 2015-01-13: 19:00:00 via INTRAVENOUS
  Filled 2015-01-13: qty 1000

## 2015-01-13 MED ORDER — METHOCARBAMOL 500 MG PO TABS
500.0000 mg | ORAL_TABLET | Freq: Four times a day (QID) | ORAL | Status: DC | PRN
  Administered 2015-01-13 – 2015-01-14 (×4): 500 mg via ORAL
  Filled 2015-01-13 (×5): qty 1

## 2015-01-13 MED ORDER — AMLODIPINE-OLMESARTAN 5-40 MG PO TABS: 1.0000 | ORAL_TABLET | Freq: Every day | ORAL | Status: DC

## 2015-01-13 MED ORDER — BUPIVACAINE IN DEXTROSE 0.75-8.25 % IT SOLN
INTRATHECAL | Status: DC | PRN
  Administered 2015-01-13: 2 mL via INTRATHECAL

## 2015-01-13 MED ORDER — ACETAMINOPHEN 325 MG PO TABS: 325.0000 mg | ORAL_TABLET | ORAL | Status: DC | PRN

## 2015-01-13 MED ORDER — SODIUM CHLORIDE 0.9 % IR SOLN
Status: DC | PRN
  Administered 2015-01-13: 3000 mL

## 2015-01-13 MED ORDER — DIPHENHYDRAMINE HCL 12.5 MG/5ML PO ELIX: 12.5000 mg | ORAL_SOLUTION | ORAL | Status: DC | PRN

## 2015-01-13 MED ORDER — METFORMIN HCL 500 MG PO TABS
1000.0000 mg | ORAL_TABLET | Freq: Every day | ORAL | Status: DC
  Filled 2015-01-13: qty 2

## 2015-01-13 MED ORDER — LINAGLIPTIN 5 MG PO TABS
5.0000 mg | ORAL_TABLET | Freq: Every day | ORAL | Status: DC
  Filled 2015-01-13: qty 1

## 2015-01-13 MED ORDER — MIDAZOLAM HCL 2 MG/2ML IJ SOLN
INTRAMUSCULAR | Status: AC
  Administered 2015-01-13: 2 mg via INTRAVENOUS
  Filled 2015-01-13: qty 2

## 2015-01-13 MED ORDER — INSULIN ASPART 100 UNIT/ML ~~LOC~~ SOLN
0.0000 [IU] | Freq: Three times a day (TID) | SUBCUTANEOUS | Status: DC
  Administered 2015-01-14: 5 [IU] via SUBCUTANEOUS
  Administered 2015-01-14 – 2015-01-15 (×3): 3 [IU] via SUBCUTANEOUS

## 2015-01-13 MED ORDER — HYDROMORPHONE HCL 1 MG/ML IJ SOLN
0.2500 mg | INTRAMUSCULAR | Status: DC | PRN
  Administered 2015-01-13: 0.5 mg via INTRAVENOUS

## 2015-01-13 MED ORDER — ARTIFICIAL TEARS OP OINT
TOPICAL_OINTMENT | OPHTHALMIC | Status: AC
  Filled 2015-01-13: qty 3.5

## 2015-01-13 MED ORDER — HYDROMORPHONE HCL 1 MG/ML IJ SOLN
INTRAMUSCULAR | Status: AC
  Filled 2015-01-13: qty 1

## 2015-01-13 MED ORDER — PHENYLEPHRINE HCL 10 MG/ML IJ SOLN
INTRAMUSCULAR | Status: DC | PRN
  Administered 2015-01-13: 80 ug via INTRAVENOUS
  Administered 2015-01-13: 40 ug via INTRAVENOUS
  Administered 2015-01-13: 80 ug via INTRAVENOUS
  Administered 2015-01-13: 40 ug via INTRAVENOUS
  Administered 2015-01-13: 80 ug via INTRAVENOUS

## 2015-01-13 MED ORDER — SITAGLIPTIN PHOS-METFORMIN HCL 50-1000 MG PO TABS: 1.0000 | ORAL_TABLET | Freq: Every day | ORAL | Status: DC

## 2015-01-13 SURGICAL SUPPLY — 60 items
BANDAGE ESMARK 6X9 LF (GAUZE/BANDAGES/DRESSINGS) ×1 IMPLANT
BLADE SAG 18X100X1.27 (BLADE) ×3 IMPLANT
BLADE SAW SGTL 13X75X1.27 (BLADE) ×3 IMPLANT
BNDG ELASTIC 6X10 VLCR STRL LF (GAUZE/BANDAGES/DRESSINGS) ×3 IMPLANT
BNDG ESMARK 6X9 LF (GAUZE/BANDAGES/DRESSINGS) ×3
BOWL SMART MIX CTS (DISPOSABLE) ×3 IMPLANT
CAPT KNEE TOTAL 3 ATTUNE ×3 IMPLANT
CEMENT HV SMART SET (Cement) ×3 IMPLANT
COVER SURGICAL LIGHT HANDLE (MISCELLANEOUS) ×3 IMPLANT
CUFF TOURNIQUET SINGLE 34IN LL (TOURNIQUET CUFF) ×3 IMPLANT
DRAPE EXTREMITY T 121X128X90 (DRAPE) ×3 IMPLANT
DRAPE U-SHAPE 47X51 STRL (DRAPES) ×3 IMPLANT
DURAPREP 26ML APPLICATOR (WOUND CARE) ×6 IMPLANT
ELECT REM PT RETURN 9FT ADLT (ELECTROSURGICAL) ×3
ELECTRODE REM PT RTRN 9FT ADLT (ELECTROSURGICAL) ×1 IMPLANT
EVACUATOR 1/8 PVC DRAIN (DRAIN) IMPLANT
GAUZE SPONGE 4X4 12PLY STRL (GAUZE/BANDAGES/DRESSINGS) ×6 IMPLANT
GAUZE XEROFORM 1X8 LF (GAUZE/BANDAGES/DRESSINGS) ×3 IMPLANT
GLOVE BIO SURGEON STRL SZ7.5 (GLOVE) ×3 IMPLANT
GLOVE BIO SURGEON STRL SZ8.5 (GLOVE) ×3 IMPLANT
GLOVE BIOGEL PI IND STRL 7.5 (GLOVE) ×1 IMPLANT
GLOVE BIOGEL PI IND STRL 8 (GLOVE) ×1 IMPLANT
GLOVE BIOGEL PI IND STRL 9 (GLOVE) ×1 IMPLANT
GLOVE BIOGEL PI INDICATOR 7.5 (GLOVE) ×2
GLOVE BIOGEL PI INDICATOR 8 (GLOVE) ×2
GLOVE BIOGEL PI INDICATOR 9 (GLOVE) ×2
GLOVE SURG SS PI 7.0 STRL IVOR (GLOVE) ×6 IMPLANT
GOWN STRL REUS W/ TWL LRG LVL3 (GOWN DISPOSABLE) ×1 IMPLANT
GOWN STRL REUS W/ TWL XL LVL3 (GOWN DISPOSABLE) ×3 IMPLANT
GOWN STRL REUS W/TWL LRG LVL3 (GOWN DISPOSABLE) ×2
GOWN STRL REUS W/TWL XL LVL3 (GOWN DISPOSABLE) ×6
HANDPIECE INTERPULSE COAX TIP (DISPOSABLE) ×2
HOOD PEEL AWAY FACE SHEILD DIS (HOOD) ×6 IMPLANT
KIT BASIN OR (CUSTOM PROCEDURE TRAY) ×3 IMPLANT
KIT ROOM TURNOVER OR (KITS) ×3 IMPLANT
MANIFOLD NEPTUNE II (INSTRUMENTS) ×3 IMPLANT
NEEDLE SPNL 18GX3.5 QUINCKE PK (NEEDLE) ×3 IMPLANT
NS IRRIG 1000ML POUR BTL (IV SOLUTION) ×3 IMPLANT
PACK TOTAL JOINT (CUSTOM PROCEDURE TRAY) ×3 IMPLANT
PACK UNIVERSAL I (CUSTOM PROCEDURE TRAY) ×3 IMPLANT
PAD ARMBOARD 7.5X6 YLW CONV (MISCELLANEOUS) ×6 IMPLANT
PADDING CAST ABS 6INX4YD NS (CAST SUPPLIES) ×2
PADDING CAST ABS COTTON 6X4 NS (CAST SUPPLIES) ×1 IMPLANT
PADDING CAST COTTON 6X4 STRL (CAST SUPPLIES) ×3 IMPLANT
SET HNDPC FAN SPRY TIP SCT (DISPOSABLE) ×1 IMPLANT
SPONGE GAUZE 4X4 12PLY STER LF (GAUZE/BANDAGES/DRESSINGS) ×6 IMPLANT
SUT VIC AB 0 CT1 27 (SUTURE) ×2
SUT VIC AB 0 CT1 27XBRD ANBCTR (SUTURE) ×1 IMPLANT
SUT VIC AB 1 CTX 36 (SUTURE) ×2
SUT VIC AB 1 CTX36XBRD ANBCTR (SUTURE) ×1 IMPLANT
SUT VIC AB 2-0 CT1 27 (SUTURE) ×2
SUT VIC AB 2-0 CT1 TAPERPNT 27 (SUTURE) ×1 IMPLANT
SUT VIC AB 3-0 CT1 27 (SUTURE) ×2
SUT VIC AB 3-0 CT1 TAPERPNT 27 (SUTURE) ×1 IMPLANT
SUT VIC AB 3-0 FS2 27 (SUTURE) ×3 IMPLANT
SYR 50ML LL SCALE MARK (SYRINGE) ×3 IMPLANT
TOWEL OR 17X24 6PK STRL BLUE (TOWEL DISPOSABLE) ×3 IMPLANT
TOWEL OR 17X26 10 PK STRL BLUE (TOWEL DISPOSABLE) ×3 IMPLANT
TRAY CATH 16FR W/PLASTIC CATH (SET/KITS/TRAYS/PACK) IMPLANT
WATER STERILE IRR 1000ML POUR (IV SOLUTION) ×9 IMPLANT

## 2015-01-13 NOTE — Anesthesia Procedure Notes (Addendum)
Procedure Name: MAC Date/Time: 01/13/2015 1:07 PM Performed by: Roney MansSMITH, JENNIFER P Pre-anesthesia Checklist: Patient identified, Timeout performed, Emergency Drugs available, Suction available and Patient being monitored Patient Re-evaluated:Patient Re-evaluated prior to inductionOxygen Delivery Method: Simple face mask Intubation Type: IV induction Dental Injury: Teeth and Oropharynx as per pre-operative assessment    Spinal Patient location during procedure: OR Staffing Anesthesiologist: Suzannah Bettes Preanesthetic Checklist Completed: patient identified, surgical consent, pre-op evaluation, timeout performed, IV checked, risks and benefits discussed and monitors and equipment checked Spinal Block Patient position: sitting Prep: site prepped and draped and DuraPrep Patient monitoring: heart rate, cardiac monitor, continuous pulse ox and blood pressure Approach: midline Location: L3-4 Injection technique: single-shot Needle Needle type: Pencan  Needle gauge: 24 G Needle length: 10 cm Assessment Sensory level: T6

## 2015-01-13 NOTE — Progress Notes (Signed)
Orthopedic Tech Progress Note Patient Details:  Darin KoyanagiBill Conner Sep 18, 1955 [id number]  CPM Right Knee CPM Right Knee: On Right Knee Flexion (Degrees): 40 Right Knee Extension (Degrees): 0  Ortho Devices Ortho Device/Splint Location: applied ohf to bed, footsie roll Ortho Device/Splint Interventions: Ordered, Application   Jennye MoccasinHughes, Darin Conner Darin 01/13/2015, 4:07 PM

## 2015-01-13 NOTE — Interval H&P Note (Signed)
History and Physical Interval Note:  01/13/2015 12:38 PM  Darin Conner  has presented today for surgery, with the diagnosis of RIGHT KNEE OSTEOARTHRITIS  The various methods of treatment have been discussed with the patient and family. After consideration of risks, benefits and other options for treatment, the patient has consented to  Procedure(s): TOTAL KNEE ARTHROPLASTY (Right) as a surgical intervention .  The patient's history has been reviewed, patient examined, no change in status, stable for surgery.  I have reviewed the patient's chart and labs.  Questions were answered to the patient's satisfaction.     Nestor LewandowskyOWAN,[name] J

## 2015-01-13 NOTE — Discharge Instructions (Signed)

## 2015-01-13 NOTE — Anesthesia Preprocedure Evaluation (Signed)
Anesthesia Evaluation  Patient identified by MRN, date of birth, ID band Patient awake    Reviewed: Allergy & Precautions, NPO status , Patient's Chart, lab work & pertinent test results  History of Anesthesia Complications Negative for: history of anesthetic complications  Airway Mallampati: II  TM Distance: >3 FB Neck ROM: Full    Dental  (+) Teeth Intact   Pulmonary sleep apnea and Continuous Positive Airway Pressure Ventilation , former smoker,    breath sounds clear to auscultation       Cardiovascular hypertension, Pt. on medications (-) angina(-) CHF (-) dysrhythmias  Rhythm:Regular     Neuro/Psych  Headaches, negative psych ROS   GI/Hepatic negative GI ROS, Neg liver ROS,   Endo/Other  diabetes, Type 2  Renal/GU Renal InsufficiencyRenal disease     Musculoskeletal  (+) Arthritis ,   Abdominal   Peds  Hematology negative hematology ROS (+)   Anesthesia Other Findings   Reproductive/Obstetrics                             Anesthesia Physical Anesthesia Plan  ASA: II  Anesthesia Plan: MAC and Spinal   Post-op Pain Management:    Induction: Intravenous  Airway Management Planned: Natural Airway, Nasal Cannula and Simple Face Mask  Additional Equipment: None  Intra-op Plan:   Post-operative Plan:   Informed Consent: I have reviewed the patients History and Physical, chart, labs and discussed the procedure including the risks, benefits and alternatives for the proposed anesthesia with the patient or authorized representative who has indicated his/her understanding and acceptance.   Dental advisory given  Plan Discussed with: CRNA and Surgeon  Anesthesia Plan Comments:         Anesthesia Quick Evaluation

## 2015-01-13 NOTE — Op Note (Signed)
PATIENT ID:      Darin KoyanagiBill Stitt  MRN:     [id number] DOB/AGE:    1955/02/23 / 59 y.o.       OPERATIVE REPORT    DATE OF PROCEDURE:  01/13/2015       PREOPERATIVE DIAGNOSIS:   RIGHT KNEE OSTEOARTHRITIS      Estimated body mass index is 35.76 kg/(m^2) as calculated from the following:   Height as of 01/04/15: 5' 11.5" (1.816 m).   Weight as of this encounter: 117.935 kg (260 lb).                                                        POSTOPERATIVE DIAGNOSIS:   RIGHT KNEE OSTEOARTHRITIS                                                                      PROCEDURE:  Procedure(s): TOTAL KNEE ARTHROPLASTY Using DepuyAttune RP implants #8RR Femur, #8Tibia, 7 mm Attune RP bearing, 38 Patella     SURGEON: Shavonte Zhao J    ASSISTANT:   Eric K. [employer]iant EnergyPhillips PA-C   (Present and scrubbed throughout the case, critical for assistance with exposure, retraction, instrumentation, and closure.)         ANESTHESIA: Spinal, 20cc Exparel, 20cc 0.5% Marcaine  EBL: 300  FLUID REPLACEMENT: 1600 crystalloid  TOURNIQUET TIME: 15min  Drains: None  Tranexamic Acid: 1gm IV   COMPLICATIONS:  None         INDICATIONS FOR PROCEDURE: The patient has  RIGHT KNEE OSTEOARTHRITIS, Var deformities, XR shows bone on bone arthritis, lateral subluxation of tibia. Patient has failed all conservative measures including anti-inflammatory medicines, narcotics, attempts at  exercise and weight loss, cortisone injections and viscosupplementation.  Risks and benefits of surgery have been discussed, questions answered.   DESCRIPTION OF PROCEDURE: The patient identified by armband, received  IV antibiotics[facility] Patient taken to the operating room, appropriate anesthetic  monitors were attached, and Spinal anesthesia was  induced. Tourniquet  applied high to the operative thigh. Lateral post and foot positioner  applied to the table, the lower extremity was then prepped and draped  in usual sterile  fashion from the toes to the tourniquet. Time-out procedure was performed. We began the operation, with the knee flexed 120 degrees, by making the anterior midline incision starting at handbreadth above the patella going over the patella 1 cm medial to and 4 cm distal to the tibial tubercle. Small bleeders in the skin and the  subcutaneous tissue identified and cauterized. Transverse retinaculum was incised and reflected medially and a medial parapatellar arthrotomy was accomplished. the patella was everted and theprepatellar fat pad resected. The superficial medial collateral  ligament was then elevated from anterior to posterior along the proximal  flare of the tibia and anterior half of the menisci resected. The knee was hyperflexed exposing bone on bone arthritis. Peripheral and notch osteophytes as well as the cruciate ligaments were then resected. We continued to  work our way around posteriorly along the proximal tibia, and externally  rotated the tibia subluxing it out from underneath the femur. A McHale  retractor was placed through the notch and a lateral Hohmann retractor  placed, and we then drilled through the proximal tibia in line with the  axis of the tibia followed by an intramedullary guide rod and 2-degree  posterior slope cutting guide. The tibial cutting guide, 3 degree posterior sloped, was pinned into place allowing resection of 4 mm of bone medially and 12 mm of bone laterally. Satisfied with the tibial resection, we then  entered the distal femur 2 mm anterior to the PCL origin with the  intramedullary guide rod and applied the distal femoral cutting guide  set at 9 mm, with 5 degrees of valgus. This was pinned along the  epicondylar axis. At this point, the distal femoral cut was accomplished without difficulty. We then sized for a #8R femoral component and pinned the guide in 3 degrees of external rotation. The chamfer cutting guide was pinned into place. The anterior,  posterior, and chamfer cuts were accomplished without difficulty followed by  the Attune RP box cutting guide and the box cut. We also removed posterior osteophytes from the posterior femoral condyles. At this  time, the knee was brought into full extension. We checked our  extension and flexion gaps and found them symmetric for a 7 mm bearing. Distracting in extension with a lamina spreader, the posterior horns of the menisci were removed, and Exparel, diluted to 60 cc, with 20cc NS, and 20cc 0.5% Marcaine,was injected into the capsule and synovium of the knee. The posterior patella cut was accomplished with the 9.5 mm Attune cutting guide, sized for a 38mm dome, and the fixation pegs drilled.The knee  was then once again hyperflexed exposing the proximal tibia. We sized for a # 8 tibial base plate, applied the smokestack and the conical reamer followed by the the Delta fin keel punch. We then hammered into place the Attune RP trial femoral component, drilled the lugs, inserted a  7 mm trial bearing, trial patellar button, and took the knee through range of motion from 0-130 degrees. No thumb pressure was required for patellar Tracking. At this point, the limb was wrapped with an Esmarch bandage and the tourniquet inflated to 350 mmHg. All trial components were removed, mating surfaces irrigated with pulse lavage, and dried with suction and sponges. A double batch of DePuy HV cement with 1500 mg of Zinacef was mixed and applied to all bony metallic mating surfaces except for the posterior condyles of the femur itself. In order, we  hammered into place the tibial tray and removed excess cement, the femoral component and removed excess cement. The final Attune RP bearing  was inserted, and the knee brought to full extension with compression.  The patellar button was clamped into place, and excess cement  removed. While the cement cured the wound was irrigated out with normal saline solution pulse lavage.  Ligament stability and patellar tracking were checked and found to be excellent. The parapatellar arthrotomy was closed with  running #1 Vicryl suture. The subcutaneous tissue with 0 and 2-0 undyed  Vicryl suture, and the skin with running 3-0 SQ vicryl. A dressing of Xeroform,  4 x 4, dressing sponges, Webril, and Ace wrap applied. The patient  awakened, and taken to recovery room without difficulty.   Nestor Lewandowsky 01/13/2015, 2:28 PM

## 2015-01-13 NOTE — Transfer of Care (Signed)
Immediate Anesthesia Transfer of Care Note  Patient: Darin Conner  Procedure(s) Performed: Procedure(s): TOTAL KNEE ARTHROPLASTY (Right)  Patient Location: PACU  Anesthesia Type:MAC  Level of Consciousness: awake, alert , oriented and patient cooperative  Airway & Oxygen Therapy: patient spontaneously breathing  Post-op Assessment: Report given to RN and Post -op Vital signs reviewed and stable  Post vital signs: Reviewed and stable  Last Vitals:  BP 82/60 -- Dr Maple HudsonMoser aware HR 78 RR 12 SpO2 96% on RA Resting comfortably, maintains good airway, denies pain.   Complications: No apparent anesthesia complications

## 2015-01-14 ENCOUNTER — Encounter (HOSPITAL_COMMUNITY): Payer: Self-pay | Admitting: General Practice

## 2015-01-14 LAB — BASIC METABOLIC PANEL
Anion gap: 9 (ref 5–15)
BUN: 10 mg/dL (ref 6–20)
CALCIUM: 8.8 mg/dL — AB (ref 8.9–10.3)
CO2: 25 mmol/L (ref 22–32)
CREATININE: 1.11 mg/dL (ref 0.61–1.24)
Chloride: 101 mmol/L (ref 101–111)
GFR calc non Af Amer: >60 mL/min (ref >60)
GLUCOSE: 265 mg/dL — AB (ref 65–99)
Potassium: 4 mmol/L (ref 3.5–5.1)
Sodium: 135 mmol/L (ref 135–145)

## 2015-01-14 LAB — GLUCOSE, CAPILLARY
GLUCOSE-CAPILLARY: 248 mg/dL — AB (ref 65–99)
GLUCOSE-CAPILLARY: 243 mg/dL — AB (ref 65–99)
GLUCOSE-CAPILLARY: 270 mg/dL — AB (ref 65–99)
Glucose-Capillary: 257 mg/dL — ABNORMAL HIGH (ref 65–99)

## 2015-01-14 LAB — CBC
HEMATOCRIT: 34.2 % — AB (ref 39.0–52.0)
Hemoglobin: 11.7 g/dL — ABNORMAL LOW (ref 13.0–17.0)
MCH: 28 pg (ref 26.0–34.0)
MCHC: 34.2 g/dL (ref 30.0–36.0)
MCV: 81.8 fL (ref 78.0–100.0)
Platelets: 126 10*3/uL — ABNORMAL LOW (ref 150–400)
RBC: 4.18 MIL/uL — ABNORMAL LOW (ref 4.22–5.81)
RDW: 12.1 % (ref 11.5–15.5)
WBC: 9.6 10*3/uL (ref 4.0–10.5)

## 2015-01-14 LAB — HEMOGLOBIN A1C
HEMOGLOBIN A1C: 6.9 % — AB (ref 4.8–5.6)
MEAN PLASMA GLUCOSE: 151 mg/dL

## 2015-01-14 MED ORDER — SITAGLIPTIN PHOS-METFORMIN HCL 50-1000 MG PO TABS
1.0000 | ORAL_TABLET | Freq: Every day | ORAL | Status: DC
  Administered 2015-01-15: 1 via ORAL

## 2015-01-14 MED ORDER — NON FORMULARY: Status: DC

## 2015-01-14 NOTE — Progress Notes (Signed)
RT. Note: Pt. had CPAP on upon arrival to room, checked, no complications, told to notify, RT to monitor.

## 2015-01-14 NOTE — Evaluation (Signed)
Occupational Therapy Evaluation Patient Details Name: Jakai Risse MRN: [id number] DOB: 09/19/1955 Today's Date: 01/14/2015    History of Present Illness 59 y.o. male s/p R TKA and has a history of pain and functional disability in the right knee due to arthritis and has failed non-surgical conservative treatments for greater than 12 weeks.    Clinical Impression   Patient presenting with decreased I in self care,balance, functional mobility/transfers, and increased acute pain.  Patient reports being independent PTA. Patient currently functioning at min - max A ( LB self care). Patient will benefit from acute OT to increase overall independence in the areas of ADLs, functional mobility, and safety in order to safely discharge home.    Follow Up Recommendations  No OT follow up;Supervision - Intermittent    Equipment Recommendations  3 in 1 bedside comode;Tub/shower seat    Recommendations for Other Services       Precautions / Restrictions Precautions Precautions: Knee Precaution Comments: educated on use of zero foam and WBAT status Restrictions Weight Bearing Restrictions: Yes RLE Weight Bearing: Weight bearing as tolerated      Mobility Bed Mobility Overal bed mobility: Needs Assistance Bed Mobility: Supine to Sit     Supine to sit: Min assist     General bed mobility comments: assistance for R LE to EOB and use of hand rails from flat bed. VCs for proper technique.  Transfers Overall transfer level: Needs assistance Equipment used: Rolling walker (2 wheeled) Transfers: Sit to/from [employer] Sit to Stand: Min guard Stand pivot transfers: Min guard       General transfer comment: min guard for safety and VCs for hand placement and proper technique    Balance Overall balance assessment: Needs assistance Sitting-balance support: Feet supported;No upper extremity supported Sitting balance-Leahy Scale: Good     Standing balance support:  During functional activity;No upper extremity supported Standing balance-Leahy Scale: Fair Standing balance comment: min guard for safety               ADL Overall ADL's : Needs assistance/impaired     Grooming: Wash/dry hands;Wash/dry face;Standing;Min guard          Statistician: Minimal assistance;RW;Stand-pivot;Comfort height toilet;Cueing for safety;Cueing for sequencing   Toileting- Clothing Manipulation and Hygiene: Sit to/from stand;Cueing for safety       Functional mobility during ADLs: Min guard;Cueing for safety;Rolling walker General ADL Comments: Pt very anxious about being out of bed for the first time. Pt requiring min guard for sit <>stand from bed height. Pt ambulating with increased time and min guard while utilizing RW for 10' into bathroom. Min guard for stand pivot onto elevated toilet seat , clothing management, and hygiene. Pt standing at sink side to wash hands with min guard . Pt then ambulated to recliner chair at end of session and OT provided education regarding recommended AE needed in order to increase I with self care.                Pertinent Vitals/Pain Pain Assessment: 0-10 Pain Score: 6  Pain Location: R knee Pain Descriptors / Indicators: Operative site guarding;Aching;Sore Pain Intervention(s): Limited activity within patient's tolerance;Monitored during session;Premedicated before session;Repositioned     Hand Dominance Right   Extremity/Trunk Assessment Upper Extremity Assessment Upper Extremity Assessment: Overall WFL for tasks assessed   Lower Extremity Assessment Lower Extremity Assessment: Defer to PT evaluation   Cervical / Trunk Assessment Cervical / Trunk Assessment: Normal   Communication Communication Communication: No difficulties  Cognition Arousal/Alertness: Awake/alert Behavior During Therapy: WFL for tasks assessed/performed Overall Cognitive Status: Within Functional Limits for tasks assessed                                 Home Living Family/patient expects to be discharged to:: Private residence Living Arrangements: Spouse/significant other Available Help at Discharge: Family;Friend(s);Available 24 hours/day Type of Home: House Home Access: Stairs to enter Enter[employer] CorporationEntrance Stairs-Number of Steps: 2 Entrance Stairs-Rails: None Home Layout: Two level;Able to live on main level with bedroom/bathroom     Bathroom Shower/Tub: Walk-in shower;Door   Foot LockerBathroom Toilet: Standard Bathroom Accessibility: Yes How Accessible: Accessible via walker Home Equipment: Walker - 2 wheels          Prior Functioning/Environment Level of Independence: Independent             OT Diagnosis: Acute pain   OT Problem List: Impaired balance (sitting and/or standing);Decreased activity tolerance;Decreased safety awareness;Pain;Decreased knowledge of precautions;Decreased range of motion;Decreased strength;Increased edema;Decreased knowledge of use of DME or AE   OT Treatment/Interventions: Self-care/ADL training;Therapeutic exercise;Energy conservation;DME and/or AE instruction;Patient/family education;Therapeutic activities;Balance training    OT Goals(Current goals can be found in the care plan section) Acute Rehab OT Goals Patient Stated Goal: to return home with family OT Goal Formulation: With patient Time For Goal Achievement: 01/28/15 Potential to Achieve Goals: Good ADL Goals Pt Will Perform Grooming: with modified independence;sitting Pt Will Perform Upper Body Bathing: with modified independence;sitting Pt Will Perform Lower Body Bathing: with supervision;sit to/from stand Pt Will Perform Upper Body Dressing: with modified independence;sitting Pt Will Perform Lower Body Dressing: with supervision;sit to/from stand Pt Will Transfer to Toilet: with modified independence;bedside commode;ambulating;stand pivot transfer Pt Will Perform Toileting - Clothing Manipulation and hygiene:  with modified independence;sit to/from stand Pt Will Perform Tub/Shower Transfer: with supervision;Shower transfer;shower seat;ambulating;Stand pivot transfer;rolling walker  OT Frequency: Min 2X/week   Barriers to D/C: Other (comment)  none known at this time          End of Session Equipment Utilized During Treatment: Rolling walker CPM Right Knee CPM Right Knee: Off Nurse Communication: Mobility status  Activity Tolerance: Patient tolerated treatment well Patient left: in chair;with call bell/phone within reach;Other (comment) (in zero foam)   Time: 2956-21300943-1018 OT Time Calculation (min): 35 min Charges:  OT General Charges $OT Visit: 1 Procedure OT Evaluation $Initial OT Evaluation Tier I: 1 Procedure OT Treatments $Self Care/Home Management : 8-22 mins G-Codes:    Lowella Gripittman, Jamarco Zaldivar L, MS, OTR/L 01/14/2015, 10:40 AM

## 2015-01-14 NOTE — Progress Notes (Addendum)
RT note: Pt. set up on CPAP @ 12 cmh20 per home setting, using own FFM per order, on room air, RT to monitor.

## 2015-01-14 NOTE — Progress Notes (Signed)
Utilization review completed.  

## 2015-01-14 NOTE — Care Management Note (Signed)
Case Management Note  Patient Details  Name: Darin Conner MRN: [id number] Date of Birth: 06/21/1955  Subjective/Objective:       S/p right total knee arthroplasty             Action/Plan: Set up with [facility] for HHPT by MD office. Spoke with patient, no change in discharge plan. Patient stated that [employer] delivered CPM to home, he already had rolling walker but will need 3N1. Contacted [employer], advised to contact another DME company for 3N1. Contacted James with [facility] and requested 3N1 be delivered to patient's room. Patient states that family will be able to assist after discharge.    Expected Discharge Date:                  Expected Discharge Plan:  Home w Home Health Services  In-House Referral:  NA  Discharge planning Services  CM Consult  Post Acute Care Choice:  Home Health, Durable Medical Equipment Choice offered to:  Patient  DME Arranged:  CPM DME Agency:  [employer]  HH Arranged:  PT HH Agency:  [facility]  Status of Service:  In process, will continue to follow  Medicare Important Message Given:    Date Medicare IM Given:    Medicare IM give by:    Date Additional Medicare IM Given:    Additional Medicare Important Message give by:     If discussed at Long Length of Stay Meetings, dates discussed:    Additional Comments:  Darin Conner, Darin Hargadon Watson, RN 01/14/2015, 2:44 PM

## 2015-01-14 NOTE — Anesthesia Postprocedure Evaluation (Signed)
Anesthesia Post Note  Patient: Darin Conner  Procedure(s) Performed: Procedure(s) (LRB): TOTAL KNEE ARTHROPLASTY (Right)  Patient location during evaluation: PACU Anesthesia Type: Spinal and MAC Level of consciousness: awake Pain management: pain level controlled Vital Signs Assessment: post-procedure vital signs reviewed and stable Respiratory status: spontaneous breathing Cardiovascular status: stable Postop Assessment: spinal receding and no signs of nausea or vomiting Anesthetic complications: no    Last Vitals:  Filed Vitals:   01/13/15 2300 01/14/15 0346  BP: 124/73 123/73  Pulse: 81 81  Temp: 37.4 C 37.4 C  Resp: 16 20    Last Pain:  Filed Vitals:   01/14/15 1519  PainSc: 7                  Thoms Barthelemy

## 2015-01-14 NOTE — Progress Notes (Signed)
Patient ID: Darin KoyanagiBill Mindel, male   DOB: 09/28/1955, 59 y.o.   MRN: [id number] PATIENT ID: Darin Conner  MRN: [id number]  DOB/AGE:  09/28/1955 / 59 y.o.  1 Day Post-Op Procedure(s) (LRB): TOTAL KNEE ARTHROPLASTY (Right)    PROGRESS NOTE Subjective: Patient is alert, oriented, no Nausea, no Vomiting, yes passing gas. Taking PO well. Denies SOB, Chest or Calf Pain. Using Incentive Spirometer, PAS in place. Ambulate WBAT, CPM 0-40 Patient reports pain as 5/10. Pt reports metformin (formulary alt) raises glu. Wants Janumet 50-1000 home Med.    Objective: Vital signs in last 24 hours: Filed Vitals:   01/13/15 1847 01/13/15 1939 01/13/15 2300 01/14/15 0346  BP: 123/80 126/87 124/73 123/73  Pulse: 75 82 81 81  Temp: 97.5 F (36.4 C) 98.1 F (36.7 C) 99.3 F (37.4 C) 99.4 F (37.4 C)  TempSrc: Oral Oral Axillary Axillary  Resp:  16 16 20   Weight:      SpO2: 99% 98% 97% 95%      Intake/Output from previous day: I/O last 3 completed shifts: In: 3687.5 [I.V.:3437.5; IV Piggyback:250] Out: 1475 [Urine:1425; Blood:50]   Intake/Output this shift:     LABORATORY DATA:  Recent Labs  01/13/15 1842 01/13/15 2214 01/14/15 0612 01/14/15 0650  WBC  --   --   --  9.6  HGB  --   --   --  11.7*  HCT  --   --   --  34.2*  PLT  --   --   --  126*  NA  --   --   --  135  K  --   --   --  4.0  CL  --   --   --  101  CO2  --   --   --  25  BUN  --   --   --  10  CREATININE  --   --   --  1.11  GLUCOSE  --   --   --  265*  GLUCAP 146* 286* 248*  --   CALCIUM  --   --   --  8.8*    Examination: Neurologically intact ABD soft Neurovascular intact Sensation intact distally Intact pulses distally Dorsiflexion/Plantar flexion intact Incision: no drainage No cellulitis present Compartment soft}  Assessment:   1 Day Post-Op Procedure(s) (LRB): TOTAL KNEE ARTHROPLASTY (Right) ADDITIONAL DIAGNOSIS: Expected Acute Blood Loss Anemia, Diabetes and Sleep Apnea  Plan: PT/OT WBAT, CPM 5/hrs  day until ROM 0-90 degrees, then D/C CPM DVT Prophylaxis:  SCDx72hrs, ASA 325 mg BID x 2 weeks, switch to Janumet 50-1000 1 po qd, home med. DISCHARGE PLAN: Home DISCHARGE NEEDS: HHPT, CPM, Walker and 3-in-1 comode seat     Karagan Lehr J 01/14/2015, 8:24 AM

## 2015-01-14 NOTE — Progress Notes (Addendum)
Physical Therapy Treatment Patient Details Name: Darin Conner MRN: [id number] DOB: 10-20-1955 Today's Date: 01/14/2015    History of Present Illness 59 y.o. male s/p R TKA on 01/13/15.  Pt with significant PMHx of DM, HTN, HA, and dizziness.    PT Comments    Pt is progressing well with his mobility and exercises.  PT plans to initiate stair training in AM and complete seated exercises in packet.  Plan for home with HHPT f/u is still appropriate.  Follow Up Recommendations  Home health PT;Supervision for mobility/OOB     Equipment Recommendations  3in1 (PT)    Recommendations for Other Services   NA     Precautions / Restrictions Precautions Precautions: Knee Precaution Booklet Issued: Yes (comment) Precaution Comments: reviewed no pillow under operative knee Restrictions RLE Weight Bearing: Weight bearing as tolerated    Mobility  Bed Mobility Overal bed mobility: Needs Assistance Bed Mobility: Sit to Supine       Sit to supine: Min assist   General bed mobility comments: Min assist to help lift right leg back into the bed.   Transfers Overall transfer level: Needs assistance Equipment used: Rolling walker (2 wheeled) Transfers: Sit to/from Stand Sit to Stand: Min guard         General transfer comment: Min guard assist to support trunk during transitions.  Verbal cues for RW use, safe hand placement and foot placement.   Ambulation/Gait Ambulation/Gait assistance: Min guard Ambulation Distance (Feet): 120 Feet Assistive device: Rolling walker (2 wheeled) Gait Pattern/deviations: Step-through pattern;Antalgic;Trunk flexed Gait velocity: decrease Gait velocity interpretation: Below normal speed for age/gender General Gait Details: Continued verbal cues for upright posture, heel to toe gait pattern and encouraged knee flexion during swing phase.           Balance Overall balance assessment: Needs assistance Sitting-balance support: Feet supported;No  upper extremity supported Sitting balance-Leahy Scale: Good     Standing balance support: Bilateral upper extremity supported;Single extremity supported;No upper extremity supported Standing balance-Leahy Scale: Fair                      Cognition Arousal/Alertness: Awake/alert Behavior During Therapy: WFL for tasks assessed/performed Overall Cognitive Status: Within Functional Limits for tasks assessed                      Exercises Total Joint Exercises Short Arc QuadBarbaraann Conner: AAROM;Right;10 reps Heel Slides: AAROM;Right;10 reps Hip ABduction/ADduction: AAROM;Right;10 reps Straight Leg Raises: AAROM;Right;10 reps Goniometric ROM: 8-45 AAROM supine in the bed        Pertinent Vitals/Pain Pain Assessment: 0-10 Pain Score: 7  Pain Location: right knee with mobility Pain Descriptors / Indicators: Aching Pain Intervention(s): Limited activity within patient's tolerance;Monitored during session;Repositioned;Patient requesting pain meds-RN notified;RN gave pain meds during session    Home Living Family/patient expects to be discharged to:: Private residence Living Arrangements: Spouse/significant other                      PT Goals (current goals can now be found in the care plan section) Acute Rehab PT Goals Patient Stated Goal: to decrease pain, decrease stiffness Progress towards PT goals: Progressing toward goals    Frequency  7X/week    PT Plan Current plan remains appropriate       End of Session   Activity Tolerance: Patient limited by pain Patient left: in bed;in CPM;with call bell/phone within reach     Time: [id number] PT Time  Calculation (min) (ACUTE ONLY): 31 min  Charges:  $Gait Training: 8-22 mins $Therapeutic Exercise: 8-22 mins                      Darin Conner B. Darin Conner, PT, DPT [phone]   01/14/2015, 3:43 PM

## 2015-01-14 NOTE — Evaluation (Signed)
Physical Therapy Evaluation Patient Details Name: Darin Conner MRN: [id number] DOB: Aug 03, 1955 Today's Date: 01/14/2015   History of Present Illness  59 y.o. male s/p R TKA on 01/13/15.  Pt with significant PMHx of DM, HTN, HA, and dizziness.  Clinical Impression  Pt is POD #1 and is moving well.  He was able to make it a good distance down the hallway with RW and worked up to supervision for gait.  Exercise education and practice initiated and pt is in the zero foam in the chair at end of session.   PT to follow acutely for deficits listed below.       Follow Up Recommendations Home health PT;Supervision for mobility/OOB    Equipment Recommendations  Rolling walker with 5" wheels    Recommendations for Other Services   NA    Precautions / Restrictions Precautions Precautions: Knee Precaution Booklet Issued: Yes (comment) Precaution Comments: knee handout given Restrictions Weight Bearing Restrictions: Yes RLE Weight Bearing: Weight bearing as tolerated      Mobility  Bed Mobility Overal bed mobility: Needs Assistance Bed Mobility: Supine to Sit     Supine to sit: Min assist     General bed mobility comments: Pt OOB in the recliner chair.   Transfers Overall transfer level: Needs assistance Equipment used: Rolling walker (2 wheeled) Transfers: Sit to/from Stand Sit to Stand: Min guard Stand pivot transfers: Min guard       General transfer comment: Min guard assist to support trunk for safety during transitions.  Verbal cues for safe hand placement.   Ambulation/Gait Ambulation/Gait assistance: Min guard;Supervision Ambulation Distance (Feet): 200 Feet Assistive device: Rolling walker (2 wheeled) Gait Pattern/deviations: Step-through pattern;Antalgic;Trunk flexed Gait velocity: decreased Gait velocity interpretation: <1.8 ft/sec, indicative of risk for recurrent falls General Gait Details: Pt with moderately antalgic gait pattern, verbal cues for upright  posture, RW adjusted down for height.           Balance Overall balance assessment: Needs assistance Sitting-balance support: Feet supported;No upper extremity supported Sitting balance-Leahy Scale: Good     Standing balance support: Bilateral upper extremity supported;No upper extremity supported;Single extremity supported Standing balance-Leahy Scale: Fair Standing balance comment: static standing with no upper extremity supported is stable.                             Pertinent Vitals/Pain Pain Assessment: 0-10 Pain Score: 4  Pain Location: right knee Pain Descriptors / Indicators: Aching;Constant;Burning Pain Intervention(s): Limited activity within patient's tolerance;Monitored during session;Premedicated before session;Repositioned    Home Living Family/patient expects to be discharged to:: Private residence Living Arrangements: Spouse/significant other Available Help at Discharge: Family;Friend(s);Available 24 hours/day Type of Home: House Home Access: Stairs to enter Entrance Stairs-Rails: None Entrance Stairs-Number of Steps: 2 Home Layout: One level Home Equipment: Walker - 2 wheels;Cane - quad      Prior Function Level of Independence: Independent         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  Pt with at least 3/5 ankle, 2/5 knee, 2+/5 hip flexion.     Cervical / Trunk Assessment: Normal  Communication   Communication: No difficulties  Cognition Arousal/Alertness: Awake/alert Behavior During Therapy: WFL for tasks assessed/performed Overall Cognitive Status: Within Functional Limits for tasks assessed                         Exercises Total Joint  Exercises Ankle Circles/Pumps: AROM;Both;20 reps Quad Sets: AROM;Right;10 reps Towel Squeeze: AROM;Both;10 reps Heel Slides: AAROM;Right;10 reps      Assessment/Plan    PT Assessment Patient needs continued PT services  PT Diagnosis Difficulty walking;Generalized weakness;Abnormality of gait;Acute pain   PT Problem List Decreased range of motion;Decreased strength;Decreased activity tolerance;Decreased balance;Decreased mobility;Decreased knowledge of use of DME;Decreased knowledge of precautions;Pain  PT Treatment Interventions DME instruction;Gait training;Stair training;Functional mobility training;Therapeutic activities;Therapeutic exercise;Balance training;Neuromuscular re-education;Patient/family education;Manual techniques;Modalities   PT Goals (Current goals can be found in the Care Plan section) Acute Rehab PT Goals Patient Stated Goal: to decrease pain, decrease stiffness PT Goal Formulation: With patient Time For Goal Achievement: 01/21/15 Potential to Achieve Goals: Good    Frequency 7X/week           End of Session   Activity Tolerance: Patient limited by pain Patient left: in chair;with call bell/phone within reach           Time: 1053-1130 PT Time Calculation (min) (ACUTE ONLY): 37 min   Charges:   PT Evaluation $Initial PT Evaluation Tier I: 1 Procedure PT Treatments $Gait Training: 8-22 mins        Shea Swalley B. Rikita Grabert, PT, DPT [phone]   01/14/2015, 11:40 AM

## 2015-01-15 LAB — CBC
HCT: 32.8 % — ABNORMAL LOW (ref 39.0–52.0)
Hemoglobin: 11.6 g/dL — ABNORMAL LOW (ref 13.0–17.0)
MCH: 28.9 pg (ref 26.0–34.0)
MCHC: 35.4 g/dL (ref 30.0–36.0)
MCV: 81.8 fL (ref 78.0–100.0)
PLATELETS: 123 10*3/uL — AB (ref 150–400)
RBC: 4.01 MIL/uL — AB (ref 4.22–5.81)
RDW: 12.3 % (ref 11.5–15.5)
WBC: 10.5 10*3/uL (ref 4.0–10.5)

## 2015-01-15 LAB — GLUCOSE, CAPILLARY: Glucose-Capillary: 250 mg/dL — ABNORMAL HIGH (ref 65–99)

## 2015-01-15 NOTE — Progress Notes (Signed)
Occupational Therapy Treatment Patient Details Name: Darin KoyanagiBill Conner MRN: [id number] DOB: 1955-03-04 Today's Date: 01/15/2015    History of present illness 59 y.o. male s/p R TKA on 01/13/15.  Pt with significant PMHx of DM, HTN, HA, and dizziness.   OT comments  Pt. Making gains with acute OT.  Able to complete bed mobility and shower stall transfer this session.  Reports he will have A at home from wife and dtr.  Will continue to follow acutely.    Follow Up Recommendations  No OT follow up;Supervision - Intermittent    Equipment Recommendations  3 in 1 bedside comode;Tub/shower seat    Recommendations for Other Services      Precautions / Restrictions Precautions Precautions: Knee Precaution Comments: reviewed no pillow under operative knee Restrictions RLE Weight Bearing: Weight bearing as tolerated       Mobility Bed Mobility Overal bed mobility: Needs Assistance Bed Mobility: Supine to Sit     Supine to sit: Supervision     General bed mobility comments: hob flat, no rails, pt. able to transition into long sitting and guide RLE OOB with use of B UES and reposition to turn and achieve sitting eob  Transfers Overall transfer level: Needs assistance Equipment used: Rolling walker (2 wheeled) Transfers: Sit to/from UGI CorporationStand;Stand Pivot Transfers Sit to Stand: Supervision Stand pivot transfers: Supervision            Balance                                   ADL Overall ADL's : Needs assistance/impaired                       Lower Body Dressing Details (indicate cue type and reason): able to reach sock on RLE in long sitting to adjust Toilet Transfer: Supervision/safety;Ambulation;Comfort height toilet;RW StatisticianToilet Transfer Details (indicate cue type and reason): able to amb. to b.room and stand to urinate Toileting- Clothing Manipulation and Hygiene: Supervision/safety;Sit to/from stand   Tub/ Shower Transfer: Walk-in shower;3 in  1;Anterior/posterior;Min guard   Functional mobility during ADLs: Min guard;Rolling walker General ADL Comments: pt. with noted improvement from previous sessions, moving well      Vision                     Perception     Praxis      Cognition   Behavior During Therapy: WFL for tasks assessed/performed Overall Cognitive Status: Within Functional Limits for tasks assessed                       Extremity/Trunk Assessment               Exercises     Shoulder Instructions       General Comments      Pertinent Vitals/ Pain       Pain Assessment: No/denies pain  Home Living                                          Prior Functioning/Environment              Frequency Min 2X/week     Progress Toward Goals  OT Goals(current goals can now be found in the care plan section)  Progress towards OT  goals: Progressing toward goals     Plan Discharge plan remains appropriate    Co-evaluation                 End of Session Equipment Utilized During Treatment: Gait belt;Rolling walker   Activity Tolerance Patient tolerated treatment well   Patient Left in chair;with call bell/phone within reach   Nurse Communication          Time: 1610-9604 OT Time Calculation (min): 15 min  Charges: OT General Charges $OT Visit: 1 Procedure OT Treatments $Self Care/Home Management : 8-22 mins  Robet Leu, COTA/L 01/15/2015, 8:07 AM

## 2015-01-15 NOTE — Discharge Summary (Signed)
Patient ID: Darin Conner MRN: [id number] DOB/AGE: Apr 14, 1955 59 y.o.  Admit date: 01/13/2015 Discharge date: 01/15/2015  Admission Diagnoses:  Principal Problem:   Primary osteoarthritis of right knee Active Problems:   Arthritis of knee   Discharge Diagnoses:  Same  Past Medical History  Diagnosis Date  . Hyperlipidemia   . Diabetes (HCC)   . Erectile dysfunction   . Osteoarthritis   . Disturbance of skin sensation   . OSA on CPAP   . Hypertension   . Headache   . Dizziness     Surgeries: Procedure(s): TOTAL KNEE ARTHROPLASTY on 01/13/2015   Consultants:    Discharged Condition: Improved  Hospital Course: Darin Conner is an 59 y.o. male who was admitted 01/13/2015 for operative treatment ofPrimary osteoarthritis of right knee. Patient has severe unremitting pain that affects sleep, daily activities, and work/hobbies. After pre-op clearance the patient was taken to the operating room on 01/13/2015 and underwent  Procedure(s): TOTAL KNEE ARTHROPLASTY.    Patient was given perioperative antibiotics: Anti-infectives    Start     Dose/Rate Route Frequency Ordered Stop   01/13/15 1200  vancomycin (VANCOCIN) 1,500 mg in sodium chloride 0.9 % 500 mL IVPB     1,500 mg 250 mL/hr over 120 Minutes Intravenous To ShortStay Surgical 01/12/15 1311 01/13/15 1320       Patient was given sequential compression devices, early ambulation, and chemoprophylaxis to prevent DVT.  Patient benefited maximally from hospital stay and there were no complications.    Recent vital signs: Patient Vitals for the past 24 hrs:  BP Temp Temp src Pulse Resp SpO2  01/15/15 0638 - 99.4 F (37.4 C) - - - -  01/15/15 0636 125/74 mmHg 100.2 F (37.9 C) Oral 85 16 98 %  01/15/15 0505 132/79 mmHg 99 F (37.2 C) Axillary 85 14 98 %  01/14/15 2316 134/73 mmHg 99.6 F (37.6 C) Oral 83 18 99 %     Recent laboratory studies:  Recent Labs  01/14/15 0650 01/15/15 0510  WBC 9.6 10.5  HGB 11.7*  11.6*  HCT 34.2* 32.8*  PLT 126* 123*  NA 135  --   K 4.0  --   CL 101  --   CO2 25  --   BUN 10  --   CREATININE 1.11  --   GLUCOSE 265*  --   CALCIUM 8.8*  --      Discharge Medications:     Medication List    STOP taking these medications        acetaminophen-codeine 300-30 MG tablet  Commonly known as:  TYLENOL #3     aspirin 81 MG tablet  Replaced by:  aspirin EC 325 MG tablet     diclofenac sodium 1 % Gel  Commonly known as:  VOLTAREN     HYDROcodone-acetaminophen 5-325 MG tablet  Commonly known as:  NORCO/VICODIN     ibuprofen 600 MG tablet  Commonly known as:  ADVIL,MOTRIN      TAKE these medications        amLODipine-olmesartan 5-40 MG tablet  Commonly known as:  AZOR  Take 1 tablet by mouth daily.     aspirin EC 325 MG tablet  Take 1 tablet (325 mg total) by mouth 2 (two) times daily.     glucose blood test strip  1 each by Other route 2 (two) times daily. Use as instructed     ICY HOT ADVANCED RELIEF EX  Apply 1 application topically as needed.  JANUMET 50-1000 MG tablet  Generic drug:  sitaGLIPtin-metformin  Take 1 tablet by mouth daily.     methocarbamol 500 MG tablet  Commonly known as:  ROBAXIN  Take 1 tablet (500 mg total) by mouth 2 (two) times daily with a meal.     OMEGA 3 PO  Take 1 capsule by mouth daily.     oxyCODONE-acetaminophen 5-325 MG tablet  Commonly known as:  ROXICET  Take 1 tablet by mouth every 4 (four) hours as needed.     pravastatin 20 MG tablet  Commonly known as:  PRAVACHOL  Take 20 mg by mouth daily.     tadalafil 20 MG tablet  Commonly known as:  CIALIS  Take 20 mg by mouth daily as needed for erectile dysfunction.     triamterene-hydrochlorothiazide 37.5-25 MG capsule  Commonly known as:  DYAZIDE  Take 1 capsule by mouth daily.        Diagnostic Studies: Dg Chest 2 View  01/04/2015  CLINICAL DATA:  Preoperative right knee replacement.  Hypertension. EXAM: CHEST  2 VIEW COMPARISON:  None.  FINDINGS: There is a probable nipple shadow at the left base. There is no edema or consolidation. Heart size and pulmonary vascularity are normal. No adenopathy. There is evidence of old rib trauma on the left, healed. IMPRESSION: Probable nipple shadow on the left; advise repeat study with nipple markers to confirm. No edema or consolidation. Electronically Signed   By: Bretta Bang III M.D.   On: 01/04/2015 09:49   Dg Chest Special View  01/13/2015  CLINICAL DATA:  Pulmonary nodule versus nipple shadow by prior PA and lateral chest 01/04/2015. EXAM: CHEST SPECIAL VIEW COMPARISON:  PA and lateral chest 01/04/2015. FINDINGS: Nipple markers are in place. Possible nodule seen on the prior examination is the patient's nipple. The lungs are clear. Heart size is normal. No pneumothorax or pleural effusion. IMPRESSION: Negative for pulmonary nodule.  Negative exam. Electronically Signed   By: Drusilla Kanner M.D.   On: 01/13/2015 11:39    Disposition: 01-Home or Self Care      Discharge Instructions    CPM    Complete by:  As directed   Continuous passive motion machine (CPM):      Use the CPM from 0 to 60  for 5 hours per day.      You may increase by 10 degrees per day.  You may break it up into 2 or 3 sessions per day.      Use CPM for 2 weeks or until you are told to stop.     Call MD / Call 911    Complete by:  As directed   If you experience chest pain or shortness of breath, CALL 911 and be transported to the hospital emergency room.  If you develope a fever above 101 F, pus (white drainage) or increased drainage or redness at the wound, or calf pain, call your surgeon's office.     Change dressing    Complete by:  As directed   Change dressing on 5, then change the dressing daily with sterile 4 x 4 inch gauze dressing and apply TED hose.  You may clean the incision with alcohol prior to redressing.     Constipation Prevention    Complete by:  As directed   Drink plenty of fluids.   Prune juice may be helpful.  You may use a stool softener, such as Colace (over the counter) 100 mg twice a day.  Use MiraLax (over the counter) for constipation as needed.     Diet - low sodium heart healthy    Complete by:  As directed      Driving restrictions    Complete by:  As directed   No driving for 2 weeks     Increase activity slowly as tolerated    Complete by:  As directed      Patient may shower    Complete by:  As directed   You may shower without a dressing once there is no drainage.  Do not wash over the wound.  If drainage remains, cover wound with plastic wrap and then shower.           Follow-up Information    Follow up with Nestor LewandowskyOWAN,FRANK J, MD In 2 weeks.   Specialty:  Orthopedic Surgery   Contact information:   [address] [place] [address] [phone]       Follow up with [facility].   Why:  They will contact you to schedule home therapy visits.    Contact information:   [address] [place] [address] [phone]        Signed: Henry RusselHILLIPS, ERIC R 01/15/2015, 9:29 AM

## 2015-01-15 NOTE — Progress Notes (Signed)
Physical Therapy Treatment Patient Details Name: Darin Conner MRN: [id number] DOB: 22-Jul-1955 Today's Date: 01/15/2015    History of Present Illness 59 y.o. male s/p R TKA on 01/13/15.  Pt with significant PMHx of DM, HTN, HA, and dizziness.    PT Comments    Pt is progressing quite nicely with gait and exercises.  He has great knee extension ROM, but is pretty tight with knee flexion ROM.  He was able to demonstrate safety simulating home entry on the steps and wife was present for the entire session.  I reinforced no pillow under his operative knee and HEP given and reviewed.  Pt is scheduled to d/c this PM home with family and HHPT.  PT will follow acutely until d/c confirmed.  Follow Up Recommendations  Home health PT;Supervision for mobility/OOB     Equipment Recommendations  3in1 (PT)    Recommendations for Other Services   NA     Precautions / Restrictions Precautions Precautions: Knee Precaution Booklet Issued: Yes (comment) Precaution Comments: knee handout reviewed with pt/wife as well as no pillow under operative knee precaution Restrictions Weight Bearing Restrictions: Yes RLE Weight Bearing: Weight bearing as tolerated    Mobility  Bed Mobility Overal bed mobility: Needs Assistance Bed Mobility: Supine to Sit     Supine to sit: Supervision     General bed mobility comments: pt OOB in recliner chair  Transfers Overall transfer level: Needs assistance Equipment used: Rolling walker (2 wheeled) Transfers: Sit to/from Stand Sit to Stand: Supervision Stand pivot transfers: Supervision       General transfer comment: supervision for safety due to slow transitions.  Verbal cues for safe transitions and hand placement.   Ambulation/Gait Ambulation/Gait assistance: Supervision Ambulation Distance (Feet): 250 Feet Assistive device: Rolling walker (2 wheeled) Gait Pattern/deviations: Step-through pattern;Antalgic;Trunk flexed Gait velocity: decreased Gait  velocity interpretation: Below normal speed for age/gender General Gait Details: Verbal cues for upright posture, relaxed shoulders, heel to toe gait pattern and increased knee flexion during swing phase.  Pt needed supervision for safety due to slow gait speed and abnormal gait pattern.   Stairs Stairs: Yes Stairs assistance: Min guard Stair Management: No rails;Step to pattern;Forwards;With walker Number of Stairs: 2 (2 curb steps x 2 simulating home entry) General stair comments: Min guard assist for safety, verbal cues for safest LE sequencing, education to wife re- where to guard, pt was able to demonstrate back correct technique on second trial.          Balance Overall balance assessment: Needs assistance Sitting-balance support: Feet supported;No upper extremity supported Sitting balance-Leahy Scale: Good     Standing balance support: Bilateral upper extremity supported;No upper extremity supported;Single extremity supported Standing balance-Leahy Scale: Fair                      Cognition Arousal/Alertness: Awake/alert Behavior During Therapy: WFL for tasks assessed/performed Overall Cognitive Status: Within Functional Limits for tasks assessed                      Exercises Total Joint Exercises Ankle Circles/Pumps: AROM;Both;20 reps Quad Sets: AROM;Right;10 reps Towel Squeeze: AROM;Both;10 reps Short Arc QuadBarbaraann Boys;Right;10 reps Heel Slides: AAROM;Right;10 reps Hip ABduction/ADduction: AAROM;Right;10 reps Straight Leg Raises: AAROM;Right;10 reps Knee Flexion: AROM;AAROM;Right;10 reps;Seated Goniometric ROM: 5-60 AAROM in sitting        Pertinent Vitals/Pain Pain Assessment: No/denies pain Pain Location: reports right knee stiffness and soreness Pain Intervention(s): Limited activity within patient's tolerance;Monitored during  session;Repositioned           PT Goals (current goals can now be found in the care plan section) Acute Rehab  PT Goals Patient Stated Goal: to go home today Progress towards PT goals: Progressing toward goals    Frequency  7X/week    PT Plan Current plan remains appropriate       End of Session   Activity Tolerance: Patient limited by fatigue Patient left: in chair;with call bell/phone within reach;with family/visitor present;with nursing/sitter in room     Time: [id number] PT Time Calculation (min) (ACUTE ONLY): 55 min  Charges:  $Gait Training: 23-37 mins $Therapeutic Exercise: 23-37 mins                      Tequlia Gonsalves B. Laquincy Eastridge, PT, DPT [phone]   01/15/2015, 10:52 AM

## 2015-01-15 NOTE — Progress Notes (Signed)
PATIENT ID: Darin KoyanagiBill Ding  MRN: [id number]  DOB/AGE:  57-Jun-1957 / 57 y.o.  2 Days Post-Op Procedure(s) (LRB): TOTAL KNEE ARTHROPLASTY (Right)    PROGRESS NOTE Subjective: Patient is alert, oriented, no Nausea, no Vomiting, yes passing gas. Taking PO well. Denies SOB, Chest or Calf Pain. Using Incentive Spirometer, PAS in place. Ambulate WBAT with pt walking 120 ft with therapy, CPM 0-50 Patient reports pain as 8/10 when walking.    Objective: Vital signs in last 24 hours: Filed Vitals:   01/14/15 2316 01/15/15 0505 01/15/15 0636 01/15/15 0638  BP: 134/73 132/79 125/74   Pulse: 83 85 85   Temp: 99.6 F (37.6 C) 99 F (37.2 C) 100.2 F (37.9 C) 99.4 F (37.4 C)  TempSrc: Oral Axillary Oral   Resp: 18 14 16    Weight:      SpO2: 99% 98% 98%       Intake/Output from previous day: I/O last 3 completed shifts: In: 2157.5 [P.O.:720; I.V.:1437.5] Out: 2625 [Urine:2625]   Intake/Output this shift: Total I/O In: 3250 [I.V.:3250] Out: -    LABORATORY DATA:  Recent Labs  01/14/15 0650  01/14/15 1624 01/14/15 2217 01/15/15 0510 01/15/15 0641  WBC 9.6  --   --   --  10.5  --   HGB 11.7*  --   --   --  11.6*  --   HCT 34.2*  --   --   --  32.8*  --   PLT 126*  --   --   --  123*  --   NA 135  --   --   --   --   --   K 4.0  --   --   --   --   --   CL 101  --   --   --   --   --   CO2 25  --   --   --   --   --   BUN 10  --   --   --   --   --   CREATININE 1.11  --   --   --   --   --   GLUCOSE 265*  --   --   --   --   --   GLUCAP  --   < > 257* 270*  --  250*  CALCIUM 8.8*  --   --   --   --   --   < > = values in this interval not displayed.  Examination: Neurologically intact Neurovascular intact Sensation intact distally Intact pulses distally Dorsiflexion/Plantar flexion intact Incision: dressing C/D/I No cellulitis present Compartment soft}  Assessment:   2 Days Post-Op Procedure(s) (LRB): TOTAL KNEE ARTHROPLASTY (Right) ADDITIONAL DIAGNOSIS: Expected  Acute Blood Loss Anemia, Diabetes  Plan: PT/OT WBAT, CPM 5/hrs day until ROM 0-90 degrees, then D/C CPM DVT Prophylaxis:  SCDx72hrs, ASA 325 mg BID x 2 weeks DISCHARGE PLAN: Home DISCHARGE NEEDS: HHPT, CPM, Walker and 3-in-1 comode seat     Lucile Didonato R 01/15/2015, 9:27 AM

## 2015-04-22 ENCOUNTER — Emergency Department (HOSPITAL_COMMUNITY)
Admission: EM | Admit: 2015-04-22 | Discharge: 2015-04-23 | Disposition: A | Discharge status: Home / Self Care | Payer: BLUE CROSS/BLUE SHIELD | Source: Home / Self Care | Attending: Emergency Medicine | Admitting: Emergency Medicine

## 2015-04-22 ENCOUNTER — Encounter (HOSPITAL_COMMUNITY): Payer: Self-pay

## 2015-04-22 DIAGNOSIS — N3091 Cystitis, unspecified with hematuria: Secondary | ICD-10-CM

## 2015-04-22 DIAGNOSIS — E785 Hyperlipidemia, unspecified: Secondary | ICD-10-CM | POA: Insufficient documentation

## 2015-04-22 DIAGNOSIS — Z87438 Personal history of other diseases of male genital organs: Secondary | ICD-10-CM | POA: Insufficient documentation

## 2015-04-22 DIAGNOSIS — E1165 Type 2 diabetes mellitus with hyperglycemia: Secondary | ICD-10-CM | POA: Insufficient documentation

## 2015-04-22 DIAGNOSIS — Z79899 Other long term (current) drug therapy: Secondary | ICD-10-CM

## 2015-04-22 DIAGNOSIS — Z7982 Long term (current) use of aspirin: Secondary | ICD-10-CM | POA: Insufficient documentation

## 2015-04-22 DIAGNOSIS — Z87891 Personal history of nicotine dependence: Secondary | ICD-10-CM | POA: Insufficient documentation

## 2015-04-22 DIAGNOSIS — Z791 Long term (current) use of non-steroidal anti-inflammatories (NSAID): Secondary | ICD-10-CM | POA: Insufficient documentation

## 2015-04-22 DIAGNOSIS — M1990 Unspecified osteoarthritis, unspecified site: Secondary | ICD-10-CM

## 2015-04-22 DIAGNOSIS — Z7984 Long term (current) use of oral hypoglycemic drugs: Secondary | ICD-10-CM | POA: Insufficient documentation

## 2015-04-22 DIAGNOSIS — R350 Frequency of micturition: Secondary | ICD-10-CM | POA: Diagnosis present

## 2015-04-22 DIAGNOSIS — G8929 Other chronic pain: Secondary | ICD-10-CM | POA: Diagnosis not present

## 2015-04-22 DIAGNOSIS — Z96651 Presence of right artificial knee joint: Secondary | ICD-10-CM | POA: Insufficient documentation

## 2015-04-22 DIAGNOSIS — M25561 Pain in right knee: Secondary | ICD-10-CM | POA: Diagnosis not present

## 2015-04-22 DIAGNOSIS — Z9981 Dependence on supplemental oxygen: Secondary | ICD-10-CM | POA: Insufficient documentation

## 2015-04-22 DIAGNOSIS — Z880 Allergy status to penicillin: Secondary | ICD-10-CM | POA: Insufficient documentation

## 2015-04-22 DIAGNOSIS — I10 Essential (primary) hypertension: Secondary | ICD-10-CM | POA: Insufficient documentation

## 2015-04-22 DIAGNOSIS — N390 Urinary tract infection, site not specified: Secondary | ICD-10-CM | POA: Diagnosis not present

## 2015-04-22 DIAGNOSIS — G4733 Obstructive sleep apnea (adult) (pediatric): Secondary | ICD-10-CM

## 2015-04-22 DIAGNOSIS — E119 Type 2 diabetes mellitus without complications: Secondary | ICD-10-CM

## 2015-04-22 LAB — URINALYSIS, ROUTINE W REFLEX MICROSCOPIC
BILIRUBIN URINE: NEGATIVE
Glucose, UA: NEGATIVE mg/dL
KETONES UR: NEGATIVE mg/dL
Nitrite: POSITIVE — AB
PROTEIN: 30 mg/dL — AB
Specific Gravity, Urine: 1.015 (ref 1.005–1.030)
pH: 7 (ref 5.0–8.0)

## 2015-04-22 LAB — URINE MICROSCOPIC-ADD ON

## 2015-04-22 NOTE — ED Provider Notes (Signed)
CSN: [id number]     Arrival date & time 04/22/15  2140 History   First MD Initiated Contact with Patient 04/22/15 2252     Chief Complaint  Patient presents with  . Hematuria   HPI  Darin Conner is a 60 year old male with PMHx of HTN, HLD and DM presenting with hematuria. Patient reports "watermelon colored" urine earlier today. He had 2 episodes of blood-tinged urine. Denies clots in his urine. He endorses associated dysuria. Denies trauma to the penis, penile discharge, penile pain, testicular pain or concern for STD. He reports a history of hematuria when he was in his 60s. He is unsure what he was diagnosed with but states that he never followed with urology for this. He denies abdominal pain or back pain. Does endorse "feeling a little off" and mild nausea. No episodes of vomiting. He has no other complaints today. Denies fevers, chills, headaches, dizziness, syncope, vomiting or diarrhea.   Past Medical History  Diagnosis Date  . Hyperlipidemia   . Diabetes (HCC)   . Erectile dysfunction   . Osteoarthritis   . Disturbance of skin sensation   . OSA on CPAP   . Hypertension   . Headache   . Dizziness    Past Surgical History  Procedure Laterality Date  . Total knee arthroplasty Right 01/13/2015  . Total knee arthroplasty Right 01/13/2015    Procedure: TOTAL KNEE ARTHROPLASTY;  Surgeon: Gean Birchwood, MD;  Location: [facility] OR;  Service: Orthopedics;  Laterality: Right;   Family History  Problem Relation Age of Onset  . Diabetes Father   . Prostate cancer Father   . Cancer Paternal Aunt   . Diabetes Sister    Social History  Substance Use Topics  . Smoking status: Former Games developer  . Smokeless tobacco: Never Used     Comment: 2003  . Alcohol Use: 0.0 oz/week     Comment: twice a week    Review of Systems  Genitourinary: Positive for dysuria and hematuria. Negative for frequency, flank pain, discharge, penile pain and testicular pain.  All other systems reviewed and are  negative.     Allergies  Penicillins  Home Medications   Prior to Admission medications   Medication Sig Start Date End Date Taking? Authorizing Provider  amLODipine-olmesartan (AZOR) 5-40 MG tablet Take 1 tablet by mouth daily.   Yes Historical Provider, MD  aspirin 81 MG chewable tablet Chew 81 mg by mouth daily.   Yes Historical Provider, MD  HYDROcodone-acetaminophen (NORCO/VICODIN) 5-325 MG tablet Take 1 tablet by mouth every 6 (six) hours as needed for moderate pain.   Yes Historical Provider, MD  ibuprofen (ADVIL,MOTRIN) 200 MG tablet Take 600 mg by mouth every 6 (six) hours as needed for moderate pain.   Yes Historical Provider, MD  JANUMET 50-1000 MG per tablet Take 1 tablet by mouth daily.  03/05/14  Yes Historical Provider, MD  meloxicam (MOBIC) 15 MG tablet Take 1 tablet by mouth daily. 04/13/15  Yes Historical Provider, MD  Omega-3 Fatty Acids (OMEGA 3 PO) Take 1 capsule by mouth daily.   Yes Historical Provider, MD  pravastatin (PRAVACHOL) 20 MG tablet Take 20 mg by mouth daily.   Yes Historical Provider, MD  tadalafil (CIALIS) 20 MG tablet Take 20 mg by mouth daily as needed for erectile dysfunction.   Yes Historical Provider, MD  triamterene-hydrochlorothiazide (DYAZIDE) 37.5-25 MG per capsule Take 1 capsule by mouth daily.   Yes Historical Provider, MD  vitamin C (ASCORBIC ACID) 250  MG tablet Take 250 mg by mouth daily.   Yes Historical Provider, MD  aspirin EC 325 MG tablet Take 1 tablet (325 mg total) by mouth 2 (two) times daily. Patient not taking: Reported on 04/22/2015 01/13/15   Allena KatzEric K Phillips, PA-C  cephALEXin (KEFLEX) 500 MG capsule Take 1 capsule (500 mg total) by mouth 2 (two) times daily. 04/23/15   Radie Berges, PA-C  glucose blood test strip 1 each by Other route 2 (two) times daily. Use as instructed    Historical Provider, MD  methocarbamol (ROBAXIN) 500 MG tablet Take 1 tablet (500 mg total) by mouth 2 (two) times daily with a meal. Patient not taking:  Reported on 04/22/2015 01/13/15   Allena KatzEric K Phillips, PA-C  oxyCODONE-acetaminophen (ROXICET) 5-325 MG tablet Take 1 tablet by mouth every 4 (four) hours as needed. Patient not taking: Reported on 04/22/2015 01/13/15   Allena KatzEric K Phillips, PA-C  phenazopyridine (PYRIDIUM) 200 MG tablet Take 1 tablet (200 mg total) by mouth 3 (three) times daily. 04/23/15   Erick Murin, PA-C   BP 144/89 mmHg  Pulse 85  Temp(Src) 98.1 F (36.7 C) (Oral)  Resp 18  SpO2 100% Physical Exam  Constitutional: He appears well-developed and well-nourished. No distress.  Nontoxic appearing  HENT:  Head: Normocephalic and atraumatic.  Eyes: Conjunctivae are normal. Right eye exhibits no discharge. Left eye exhibits no discharge. No scleral icterus.  Neck: Normal range of motion.  Cardiovascular: Normal rate and regular rhythm.   Pulmonary/Chest: Effort normal. No respiratory distress.  Abdominal: Soft. Bowel sounds are normal. He exhibits no distension. There is no tenderness. There is no rebound and no guarding.  No CVA tenderness  Genitourinary:  Circumcised. No penile discharge. Normal external male genitalia.   Musculoskeletal: Normal range of motion.  Neurological: He is alert. Coordination normal.  Skin: Skin is warm and dry.  Psychiatric: He has a normal mood and affect. His behavior is normal.  Nursing note and vitals reviewed.   ED Course  Procedures (including critical care time) Labs Review Labs Reviewed  URINALYSIS, ROUTINE W REFLEX MICROSCOPIC (NOT AT [facility]) - Abnormal; Notable for the following:    APPearance CLOUDY (*)    Hgb urine dipstick LARGE (*)    Protein, ur 30 (*)    Nitrite POSITIVE (*)    Leukocytes, UA LARGE (*)    All other components within normal limits  CBC WITH DIFFERENTIAL/PLATELET - Abnormal; Notable for the following:    WBC 13.3 (*)    Hemoglobin 12.8 (*)    HCT 36.1 (*)    MCV 77.3 (*)    Neutro Abs 11.6 (*)    Lymphs Abs 0.5 (*)    All other components within normal  limits  BASIC METABOLIC PANEL - Abnormal; Notable for the following:    Sodium 134 (*)    Chloride 99 (*)    Glucose, Bld 165 (*)    All other components within normal limits  URINE MICROSCOPIC-ADD ON - Abnormal; Notable for the following:    Squamous Epithelial / LPF 0-5 (*)    Bacteria, UA MANY (*)    Casts GRANULAR CAST (*)    All other components within normal limits  URINE CULTURE    Imaging Review No results found. I have personally reviewed and evaluated these images and lab results as part of my medical decision-making.   EKG Interpretation None      MDM   Final diagnoses:  Hemorrhagic cystitis   60 year old  male presenting with hematuria and dysuria x 1 day. Pt is afebrile and nontoxic appearing. Abdomen is soft, non-tender without peritoneal signs. No CVA tenderness. Normal circumcised male genitalia. WBC 13.3. UA positive for Hgb, leuks, nitrites and bacteria. 1 gram rocephin given in ED. Pt has listed history of PCN allergy. Discussed with pt who reports red, blotchy skin when he took PCN as a child. Denies anaphylactic response. Monitored pt after receiving abx with no adverse reaction. Will discharge pt with keflex x 10 days. Pt is to follow up with urology to discuss today's ED visit. Return precautions given in discharge paperwork and discussed with pt at bedside. Pt stable for discharge     Alveta Heimlich, PA-C 04/23/15 0104  April Palumbo, MD 04/23/15 [phone]

## 2015-04-22 NOTE — ED Notes (Signed)
Pt states that he's been having hematuria today, no clots

## 2015-04-23 ENCOUNTER — Emergency Department (HOSPITAL_COMMUNITY)
Admission: EM | Admit: 2015-04-23 | Discharge: 2015-04-24 | Disposition: A | Discharge status: Home / Self Care | Payer: BLUE CROSS/BLUE SHIELD | Attending: Physician Assistant | Admitting: Physician Assistant

## 2015-04-23 ENCOUNTER — Encounter (HOSPITAL_COMMUNITY): Payer: Self-pay | Admitting: Emergency Medicine

## 2015-04-23 DIAGNOSIS — R739 Hyperglycemia, unspecified: Secondary | ICD-10-CM

## 2015-04-23 DIAGNOSIS — N390 Urinary tract infection, site not specified: Secondary | ICD-10-CM

## 2015-04-23 LAB — URINE MICROSCOPIC-ADD ON

## 2015-04-23 LAB — BASIC METABOLIC PANEL
ANION GAP: 12 (ref 5–15)
Anion gap: 10 (ref 5–15)
BUN: 13 mg/dL (ref 6–20)
BUN: 18 mg/dL (ref 6–20)
CALCIUM: 9.3 mg/dL (ref 8.9–10.3)
CO2: 21 mmol/L — ABNORMAL LOW (ref 22–32)
CO2: 25 mmol/L (ref 22–32)
CREATININE: 1.2 mg/dL (ref 0.61–1.24)
Calcium: 8.9 mg/dL (ref 8.9–10.3)
Chloride: 103 mmol/L (ref 101–111)
Chloride: 99 mmol/L — ABNORMAL LOW (ref 101–111)
Creatinine, Ser: 1.32 mg/dL — ABNORMAL HIGH (ref 0.61–1.24)
GFR, EST NON AFRICAN AMERICAN: 58 mL/min — AB (ref >60)
GLUCOSE: 234 mg/dL — AB (ref 65–99)
Glucose, Bld: 165 mg/dL — ABNORMAL HIGH (ref 65–99)
Potassium: 3.6 mmol/L (ref 3.5–5.1)
Potassium: 3.8 mmol/L (ref 3.5–5.1)
SODIUM: 136 mmol/L (ref 135–145)
SODIUM: 134 mmol/L — AB (ref 135–145)

## 2015-04-23 LAB — URINALYSIS, ROUTINE W REFLEX MICROSCOPIC
Glucose, UA: NEGATIVE mg/dL
KETONES UR: 15 mg/dL — AB
NITRITE: POSITIVE — AB
PROTEIN: 30 mg/dL — AB
Specific Gravity, Urine: 1.019 (ref 1.005–1.030)
pH: 5 (ref 5.0–8.0)

## 2015-04-23 LAB — CBC WITH DIFFERENTIAL/PLATELET
BASOS ABS: 0 10*3/uL (ref 0.0–0.1)
BASOS PCT: 0 %
EOS PCT: 2 %
Eosinophils Absolute: 0.2 10*3/uL (ref 0.0–0.7)
HEMATOCRIT: 36.1 % — AB (ref 39.0–52.0)
Hemoglobin: 12.8 g/dL — ABNORMAL LOW (ref 13.0–17.0)
Lymphocytes Relative: 4 %
Lymphs Abs: 0.5 10*3/uL — ABNORMAL LOW (ref 0.7–4.0)
MCH: 27.4 pg (ref 26.0–34.0)
MCHC: 35.5 g/dL (ref 30.0–36.0)
MCV: 77.3 fL — ABNORMAL LOW (ref 78.0–100.0)
MONO ABS: 1 10*3/uL (ref 0.1–1.0)
Monocytes Relative: 7 %
NEUTROS ABS: 11.6 10*3/uL — AB (ref 1.7–7.7)
Neutrophils Relative %: 87 %
PLATELETS: 165 10*3/uL (ref 150–400)
RBC: 4.67 MIL/uL (ref 4.22–5.81)
RDW: 13.1 % (ref 11.5–15.5)
WBC: 13.3 10*3/uL — ABNORMAL HIGH (ref 4.0–10.5)

## 2015-04-23 LAB — CBC
HCT: 38.6 % — ABNORMAL LOW (ref 39.0–52.0)
Hemoglobin: 13.3 g/dL (ref 13.0–17.0)
MCH: 26.9 pg (ref 26.0–34.0)
MCHC: 34.5 g/dL (ref 30.0–36.0)
MCV: 78 fL (ref 78.0–100.0)
PLATELETS: 123 10*3/uL — AB (ref 150–400)
RBC: 4.95 MIL/uL (ref 4.22–5.81)
RDW: 13.3 % (ref 11.5–15.5)
WBC: 13.9 10*3/uL — AB (ref 4.0–10.5)

## 2015-04-23 LAB — CBG MONITORING, ED: GLUCOSE-CAPILLARY: 204 mg/dL — AB (ref 65–99)

## 2015-04-23 MED ORDER — CEPHALEXIN 500 MG PO CAPS: 500.0000 mg | ORAL_CAPSULE | Freq: Two times a day (BID) | ORAL | Status: DC

## 2015-04-23 MED ORDER — SODIUM CHLORIDE 0.9 % IV BOLUS (SEPSIS)
1000.0000 mL | Freq: Once | INTRAVENOUS | Status: AC
  Administered 2015-04-23: 1000 mL via INTRAVENOUS

## 2015-04-23 MED ORDER — CEFTRIAXONE SODIUM 1 G IJ SOLR
1.0000 g | Freq: Once | INTRAMUSCULAR | Status: AC
  Administered 2015-04-23: 1 g via INTRAMUSCULAR
  Filled 2015-04-23: qty 10

## 2015-04-23 MED ORDER — PHENAZOPYRIDINE HCL 200 MG PO TABS: 200.0000 mg | ORAL_TABLET | Freq: Three times a day (TID) | ORAL | Status: DC

## 2015-04-23 MED ORDER — LIDOCAINE HCL (PF) 1 % IJ SOLN
INTRAMUSCULAR | Status: AC
  Administered 2015-04-23: 2.1 mL
  Filled 2015-04-23: qty 5

## 2015-04-23 MED ORDER — DEXTROSE 5 % IV SOLN
1.0000 g | Freq: Once | INTRAVENOUS | Status: AC
  Administered 2015-04-23: 1 g via INTRAVENOUS
  Filled 2015-04-23: qty 10

## 2015-04-23 NOTE — ED Notes (Signed)
Per family[facility]. Pt states hes been peeing a lot. Pt states it burns when he pees. States his urine is pink colored. Pt also c/o R knee pain and its swollen, knee pain has been going on for a while. Pt is ambulatory. Had knee surgery in Dec. Pt [id number].

## 2015-04-23 NOTE — ED Provider Notes (Signed)
CSN: [id number]     Arrival date & time 04/23/15  1915 History   First MD Initiated Contact with Patient 04/23/15 2138     Chief Complaint  Patient presents with  . Knee Pain  . Hematuria  . Urinary Frequency     (Consider location/radiation/quality/duration/timing/severity/associated sxs/prior Treatment) HPI Comments: Patient with a history of DM, HTN, HLD presents with complaint of persistent dysuria, hematuria, chills and increasing blood sugar since being diagnosed with UTI yesterday while at [facility] ED. He was given IM Rocephin at the time and discharged home with Keflex and pyridium.  He reports that his dysuria is improved but still present since starting the medication. Per his wife, the consulted his primary care physician who recommended recheck in the emergency department given his increased blood sugar and chills. He has had a low grade temperature consistently since yesterday.  Patient is a 60 y.o. male presenting with knee pain, hematuria, and frequency. The history is provided by the patient and the spouse. No language interpreter was used.  Knee Pain Associated symptoms: fever   Hematuria Associated symptoms include chills and a fever. Pertinent negatives include no coughing.  Urinary Frequency Associated symptoms include chills and a fever. Pertinent negatives include no coughing.    Past Medical History  Diagnosis Date  . Hyperlipidemia   . Diabetes (HCC)   . Erectile dysfunction   . Osteoarthritis   . Disturbance of skin sensation   . OSA on CPAP   . Hypertension   . Headache   . Dizziness    Past Surgical History  Procedure Laterality Date  . Total knee arthroplasty Right 01/13/2015  . Total knee arthroplasty Right 01/13/2015    Procedure: TOTAL KNEE ARTHROPLASTY;  Surgeon: Gean Birchwood, MD;  Location: [facility] OR;  Service: Orthopedics;  Laterality: Right;   Family History  Problem Relation Age of Onset  . Diabetes Father   . Prostate cancer Father   .  Cancer Paternal Aunt   . Diabetes Sister    Social History  Substance Use Topics  . Smoking status: Former Games developer  . Smokeless tobacco: Never Used     Comment: 2003  . Alcohol Use: 0.0 oz/week     Comment: twice a week    Review of Systems  Constitutional: Positive for fever, chills and appetite change.  HENT: Negative.   Respiratory: Negative.  Negative for cough and shortness of breath.   Cardiovascular: Negative.   Gastrointestinal: Negative.   Genitourinary: Positive for frequency and hematuria.  Musculoskeletal: Negative.   Skin: Negative.   Neurological: Negative.       Allergies  Penicillins  Home Medications   Prior to Admission medications   Medication Sig Start Date End Date Taking? Authorizing Provider  amLODipine-olmesartan (AZOR) 5-40 MG tablet Take 1 tablet by mouth daily.   Yes Historical Provider, MD  aspirin 81 MG chewable tablet Chew 81 mg by mouth daily.   Yes Historical Provider, MD  cephALEXin (KEFLEX) 500 MG capsule Take 1 capsule (500 mg total) by mouth 2 (two) times daily. 04/23/15  Yes Stevi Barrett, PA-C  HYDROcodone-acetaminophen (NORCO/VICODIN) 5-325 MG tablet Take 1 tablet by mouth every 6 (six) hours as needed for moderate pain.   Yes Historical Provider, MD  ibuprofen (ADVIL,MOTRIN) 200 MG tablet Take 600 mg by mouth every 6 (six) hours as needed for moderate pain.   Yes Historical Provider, MD  JANUMET 50-1000 MG per tablet Take 1 tablet by mouth daily.  03/05/14  Yes Historical Provider,  MD  meloxicam (MOBIC) 15 MG tablet Take 1 tablet by mouth daily. 04/13/15  Yes Historical Provider, MD  Omega-3 Fatty Acids (OMEGA 3 PO) Take 1 capsule by mouth daily.   Yes Historical Provider, MD  phenazopyridine (PYRIDIUM) 200 MG tablet Take 1 tablet (200 mg total) by mouth 3 (three) times daily. 04/23/15  Yes Stevi Barrett, PA-C  pravastatin (PRAVACHOL) 20 MG tablet Take 20 mg by mouth daily.   Yes Historical Provider, MD  tadalafil (CIALIS) 20 MG tablet Take  20 mg by mouth daily as needed for erectile dysfunction.   Yes Historical Provider, MD  triamterene-hydrochlorothiazide (DYAZIDE) 37.5-25 MG per capsule Take 1 capsule by mouth daily.   Yes Historical Provider, MD  vitamin C (ASCORBIC ACID) 250 MG tablet Take 250 mg by mouth daily.   Yes Historical Provider, MD  aspirin EC 325 MG tablet Take 1 tablet (325 mg total) by mouth 2 (two) times daily. Patient not taking: Reported on 04/22/2015 01/13/15   Allena Katz, PA-C  glucose blood test strip 1 each by Other route 2 (two) times daily. Use as instructed    Historical Provider, MD  methocarbamol (ROBAXIN) 500 MG tablet Take 1 tablet (500 mg total) by mouth 2 (two) times daily with a meal. Patient not taking: Reported on 04/22/2015 01/13/15   Allena Katz, PA-C  oxyCODONE-acetaminophen (ROXICET) 5-325 MG tablet Take 1 tablet by mouth every 4 (four) hours as needed. Patient not taking: Reported on 04/22/2015 01/13/15   Allena Katz, PA-C   BP 129/68 mmHg  Pulse 97  Temp(Src) 100.4 F (38 C) (Oral)  Resp 18  Ht  (1.803 m)  Wt 115.577 kg  BMI 35.55 kg/m2  SpO2 97% Physical Exam  Constitutional: He is oriented to person, place, and time. He appears well-developed and well-nourished.  HENT:  Head: Normocephalic.  Neck: Normal range of motion. Neck supple.  Cardiovascular: Normal rate.   Pulmonary/Chest: Effort normal. He has no wheezes. He has no rales.  Abdominal: Soft. Bowel sounds are normal. There is no tenderness. There is no rebound and no guarding.  Musculoskeletal: Normal range of motion.  Neurological: He is alert and oriented to person, place, and time.  Skin: Skin is warm and dry.  Psychiatric: He has a normal mood and affect.    ED Course  Procedures (including critical care time) Labs Review Labs Reviewed  URINALYSIS, ROUTINE W REFLEX MICROSCOPIC (NOT AT [facility]) - Abnormal; Notable for the following:    Color, Urine ORANGE (*)    APPearance CLOUDY (*)    Hgb  urine dipstick TRACE (*)    Bilirubin Urine SMALL (*)    Ketones, ur 15 (*)    Protein, ur 30 (*)    Nitrite POSITIVE (*)    Leukocytes, UA MODERATE (*)    All other components within normal limits  CBC - Abnormal; Notable for the following:    WBC 13.9 (*)    HCT 38.6 (*)    Platelets 123 (*)    All other components within normal limits  BASIC METABOLIC PANEL - Abnormal; Notable for the following:    CO2 21 (*)    Glucose, Bld 234 (*)    Creatinine, Ser 1.32 (*)    GFR calc non Af Amer 58 (*)    All other components within normal limits  URINE MICROSCOPIC-ADD ON - Abnormal; Notable for the following:    Squamous Epithelial / LPF 0-5 (*)    Bacteria, UA RARE (*)  All other components within normal limits  CBG MONITORING, ED - Abnormal; Notable for the following:    Glucose-Capillary 204 (*)    All other components within normal limits    Imaging Review No results found. I have personally reviewed and evaluated these images and lab results as part of my medical decision-making.   EKG Interpretation None      MDM   Final diagnoses:  None    1. UTI 2. Hyperglycemia  The patient is given IVF's, a second dose rocephin IV. Will recheck CBG after fluids.  12:30 - CBG trending down. VSS - no evidence of sepsis. He is alert, oriented, in NAD. Complains of knee pain but reports this is chronic and unchanged. He has been in touch with urology and is in the process of scheduling appropriate follow up. He is felt stable for discharge home.  Elpidio AnisShari Manasseh Pittsley, PA-C 04/24/15 0029  Courteney Randall AnLyn Mackuen, MD 04/24/15 [id number]

## 2015-04-23 NOTE — Discharge Instructions (Signed)
Schedule a follow up appointment with urology.    Urinary Tract Infection Urinary tract infections (UTIs) can develop anywhere along your urinary tract. Your urinary tract is your body's drainage system for removing wastes and extra water. Your urinary tract includes two kidneys, two ureters, a bladder, and a urethra. Your kidneys are a pair of bean-shaped organs. Each kidney is about the size of your fist. They are located below your ribs, one on each side of your spine. CAUSES Infections are caused by microbes, which are microscopic organisms, including fungi, viruses, and bacteria. These organisms are so small that they can only be seen through a microscope. Bacteria are the microbes that most commonly cause UTIs. SYMPTOMS  Symptoms of UTIs may vary by age and gender of the patient and by the location of the infection. Symptoms in young women typically include a frequent and intense urge to urinate and a painful, burning feeling in the bladder or urethra during urination. Older women and men are more likely to be tired, shaky, and weak and have muscle aches and abdominal pain. A fever may mean the infection is in your kidneys. Other symptoms of a kidney infection include pain in your back or sides below the ribs, nausea, and vomiting. DIAGNOSIS To diagnose a UTI, your caregiver will ask you about your symptoms. Your caregiver will also ask you to provide a urine sample. The urine sample will be tested for bacteria and white blood cells. White blood cells are made by your body to help fight infection. TREATMENT  Typically, UTIs can be treated with medication. Because most UTIs are caused by a bacterial infection, they usually can be treated with the use of antibiotics. The choice of antibiotic and length of treatment depend on your symptoms and the type of bacteria causing your infection. HOME CARE INSTRUCTIONS  If you were prescribed antibiotics, take them exactly as your caregiver instructs you.  Finish the medication even if you feel better after you have only taken some of the medication.  Drink enough water and fluids to keep your urine clear or pale yellow.  Avoid caffeine, tea, and carbonated beverages. They tend to irritate your bladder.  Empty your bladder often. Avoid holding urine for long periods of time.  Empty your bladder before and after sexual intercourse.  After a bowel movement, women should cleanse from front to back. Use each tissue only once. SEEK MEDICAL CARE IF:   You have back pain.  You develop a fever.  Your symptoms do not begin to resolve within 3 days. SEEK IMMEDIATE MEDICAL CARE IF:   You have severe back pain or lower abdominal pain.  You develop chills.  You have nausea or vomiting.  You have continued burning or discomfort with urination. MAKE SURE YOU:   Understand these instructions.  Will watch your condition.  Will get help right away if you are not doing well or get worse.   This information is not intended to replace advice given to you by your health care provider. Make sure you discuss any questions you have with your health care provider.   Document Released: 10/26/2004 Document Revised: 10/07/2014 Document Reviewed: 02/24/2011 Elsevier Interactive Patient Education [employer]2016 Elsevier Inc.

## 2015-04-24 LAB — CBG MONITORING, ED: GLUCOSE-CAPILLARY: 162 mg/dL — AB (ref 65–99)

## 2015-04-24 NOTE — ED Notes (Signed)
CBG is 162. 

## 2015-04-24 NOTE — Discharge Instructions (Signed)

## 2015-04-25 LAB — URINE CULTURE: Culture: >=100000

## 2015-04-26 ENCOUNTER — Telehealth (HOSPITAL_BASED_OUTPATIENT_CLINIC_OR_DEPARTMENT_OTHER): Payer: Self-pay | Admitting: Emergency Medicine

## 2015-04-26 NOTE — Telephone Encounter (Signed)
Post ED Visit - Positive Culture Follow-up  Culture report reviewed by antimicrobial stewardship pharmacist:  []  Enzo BiNathan Batchelder, Pharm.D. []  Celedonio MiyamotoJeremy Frens, Pharm.D., BCPS []  Garvin FilaMike Maccia, Pharm.D. []  Georgina PillionElizabeth Martin, Pharm.D., BCPS []  HillburnMinh Pham, [address]Ph[address]D., BCPS, AAHIVP []  Estella HuskMichelle Turner, Pharm.D., BCPS, AAHIVP []  Tennis Mustassie Stewart, Pharm.D. []  Sherle Poeob Vincent, [address]Ph[address] Meagan Decker pharmD  Positive urine culture E. coli Treated with cephalexin, organism sensitive to the same and no further patient follow-up is required at this time.  Berle MullMiller, Haelee Bolen 04/26/2015, 10:24 AM

## 2015-05-18 ENCOUNTER — Other Ambulatory Visit: Payer: Self-pay | Admitting: Orthopedic Surgery

## 2015-05-20 ENCOUNTER — Encounter (HOSPITAL_COMMUNITY): Payer: Self-pay

## 2015-05-20 ENCOUNTER — Encounter (HOSPITAL_COMMUNITY)
Admission: RE | Admit: 2015-05-20 | Discharge: 2015-05-20 | Disposition: A | Discharge status: Home / Self Care | Payer: BLUE CROSS/BLUE SHIELD | Source: Ambulatory Visit | Attending: Orthopedic Surgery | Admitting: Orthopedic Surgery

## 2015-05-20 DIAGNOSIS — Z0183 Encounter for blood typing: Secondary | ICD-10-CM | POA: Diagnosis not present

## 2015-05-20 DIAGNOSIS — Z01812 Encounter for preprocedural laboratory examination: Secondary | ICD-10-CM | POA: Diagnosis not present

## 2015-05-20 DIAGNOSIS — M1712 Unilateral primary osteoarthritis, left knee: Secondary | ICD-10-CM | POA: Insufficient documentation

## 2015-05-20 LAB — BASIC METABOLIC PANEL
ANION GAP: 12 (ref 5–15)
BUN: 14 mg/dL (ref 6–20)
CALCIUM: 9.7 mg/dL (ref 8.9–10.3)
CO2: 21 mmol/L — ABNORMAL LOW (ref 22–32)
Chloride: 106 mmol/L (ref 101–111)
Creatinine, Ser: 1.03 mg/dL (ref 0.61–1.24)
GLUCOSE: 145 mg/dL — AB (ref 65–99)
POTASSIUM: 3.9 mmol/L (ref 3.5–5.1)
Sodium: 139 mmol/L (ref 135–145)

## 2015-05-20 LAB — GLUCOSE, CAPILLARY: GLUCOSE-CAPILLARY: 143 mg/dL — AB (ref 65–99)

## 2015-05-20 LAB — CBC WITH DIFFERENTIAL/PLATELET
BASOS ABS: 0 10*3/uL (ref 0.0–0.1)
BASOS PCT: 1 %
Eosinophils Absolute: 0.2 10*3/uL (ref 0.0–0.7)
Eosinophils Relative: 3 %
HEMATOCRIT: 38.5 % — AB (ref 39.0–52.0)
HEMOGLOBIN: 13.2 g/dL (ref 13.0–17.0)
LYMPHS PCT: 11 %
Lymphs Abs: 0.7 10*3/uL (ref 0.7–4.0)
MCH: 27.1 pg (ref 26.0–34.0)
MCHC: 34.3 g/dL (ref 30.0–36.0)
MCV: 79.1 fL (ref 78.0–100.0)
MONO ABS: 0.6 10*3/uL (ref 0.1–1.0)
Monocytes Relative: 9 %
NEUTROS ABS: 5 10*3/uL (ref 1.7–7.7)
NEUTROS PCT: 76 %
Platelets: 146 10*3/uL — ABNORMAL LOW (ref 150–400)
RBC: 4.87 MIL/uL (ref 4.22–5.81)
RDW: 13.2 % (ref 11.5–15.5)
WBC: 6.6 10*3/uL (ref 4.0–10.5)

## 2015-05-20 LAB — URINALYSIS, ROUTINE W REFLEX MICROSCOPIC
BILIRUBIN URINE: NEGATIVE
Glucose, UA: NEGATIVE mg/dL
Hgb urine dipstick: NEGATIVE
KETONES UR: NEGATIVE mg/dL
Leukocytes, UA: NEGATIVE
NITRITE: NEGATIVE
Protein, ur: NEGATIVE mg/dL
Specific Gravity, Urine: 1.008 (ref 1.005–1.030)
pH: 7 (ref 5.0–8.0)

## 2015-05-20 LAB — SURGICAL PCR SCREEN
MRSA, PCR: NEGATIVE
Staphylococcus aureus: NEGATIVE

## 2015-05-20 LAB — TYPE AND SCREEN
ABO/RH(D): O NEG
ANTIBODY SCREEN: NEGATIVE

## 2015-05-20 LAB — PROTIME-INR
INR: 1.12 (ref 0.00–1.49)
Prothrombin Time: 14.6 seconds (ref 11.6–15.2)

## 2015-05-20 LAB — APTT: APTT: 26 s (ref 24–37)

## 2015-05-20 NOTE — Pre-Procedure Instructions (Signed)
Darin Conner  05/20/2015      [facility] [place] [address] [place] [address] Phone: [phone] Fax: [phone]    Your procedure is scheduled on Monday May 1st at 10:30am  [facility] at 8:30 am  Call this number if you have problems the morning of surgery:  [phone]   Remember:  Do not eat food or drink liquids after midnight.  Take these medicines the morning of surgery with A SIP OF WATER: amlodipine, pain pill if needed, Zantac (ranitidine), nasacort if needed   Starting Monday stop taking your Apirin, Meloxicam and Ibuprofen (and any other Nsaids such as aleve, motrin, advil, naproxin), Vitamins and supplements including your Omega 3  Do not take oral diabetes medicines (pills) the morning of surgery!                                                        How to Manage Your Diabetes Before and After Surgery  Why is it important to control my blood sugar before and after surgery? . Improving blood sugar levels before and after surgery helps healing and can limit problems. . A way of improving blood sugar control is eating a healthy diet by: o  Eating less sugar and carbohydrates o  Increasing activity/exercise o  Talking with your doctor about reaching your blood sugar goals . High blood sugars (greater than 180 mg/dL) can raise your risk of infections and slow your recovery, so you will need to focus on controlling your diabetes during the weeks before surgery. . Make sure that the doctor who takes care of your diabetes knows about your planned surgery including the date and location.  How do I manage my blood sugar before surgery? . Check your blood sugar at least 4 times a day, starting 2 days before surgery, to make sure that the level is not too high or low. o Check your blood sugar the morning of your surgery when you wake up and every 2 hours until you get to  the Short Stay unit. . If your blood sugar is less than 70 mg/dL, you will need to treat for low blood sugar: o Do not take insulin. o Treat a low blood sugar (less than 70 mg/dL) with  cup of clear juice (cranberry or apple), 4 glucose tablets, OR glucose gel. o Recheck blood sugar in 15 minutes after treatment (to make sure it is greater than 70 mg/dL). If your blood sugar is not greater than 70 mg/dL on recheck, call [phone] for further instructions. . Report your blood sugar to the short stay nurse when you get to Short Stay.  . If you are admitted to the hospital after surgery: o Your blood sugar will be checked by the staff and you will probably be given insulin after surgery (instead of oral diabetes medicines) to make sure you have good blood sugar levels. o The goal for blood sugar control after surgery is 80-180 mg               Do not wear jewelry, make-up or nail polish.  Do not wear lotions, powders, or perfumes.  You may wear deodorant.  Do not shave 48 hours prior to surgery.  Men may shave face and neck.  Do not bring valuables to the hospital.  [facility] is not responsible for any belongings or valuables.  Contacts, dentures or bridgework may not be worn into surgery.  Leave your suitcase in the car.  After surgery it may be brought to your room.  For patients admitted to the hospital, discharge time will be determined by your treatment team.  Patients discharged the day of surgery will not be allowed to drive home.   Special instructions:  Shower with CHG as instructed the night before and morning of your surgery.  Please read over the following fact sheets that you were given. Pain Booklet, Coughing and Deep Breathing, MRSA Information and Surgical Site Infection Prevention

## 2015-05-20 NOTE — Progress Notes (Signed)
Pt to have left knee arthroplasty.  Right knee done in December without issues.  Denies cardiologist and heart studies.  EKG and CXR from December 2016

## 2015-05-21 LAB — HEMOGLOBIN A1C
Hgb A1c MFr Bld: 6.5 % — ABNORMAL HIGH (ref 4.8–5.6)
Mean Plasma Glucose: 140 mg/dL

## 2015-05-28 DIAGNOSIS — M1712 Unilateral primary osteoarthritis, left knee: Secondary | ICD-10-CM | POA: Diagnosis present

## 2015-05-28 NOTE — H&P (Signed)
TOTAL KNEE ADMISSION H&P  Patient is being admitted for left total knee arthroplasty.  Subjective:  Chief Complaint:left knee pain.  HPI: Darin Conner, 60 y.o. male, has a history of pain and functional disability in the left knee due to arthritis and has failed non-surgical conservative treatments for greater than 12 weeks to includeNSAID's and/or analgesics, corticosteriod injections, viscosupplementation injections, flexibility and strengthening excercises, weight reduction as appropriate and activity modification.  Onset of symptoms was gradual, starting 2 years ago with gradually worsening course since that time. The patient noted no past surgery on the left knee(s).  Patient currently rates pain in the left knee(s) at 10 out of 10 with activity. Patient has night pain, worsening of pain with activity and weight bearing, pain that interferes with activities of daily living, pain with passive range of motion, crepitus and joint swelling.  Patient has evidence of subchondral sclerosis and joint space narrowing by imaging studies.  There is no active infection.  Patient Active Problem List   Diagnosis Date Noted  . Primary osteoarthritis of left knee 05/28/2015  . Primary osteoarthritis of right knee 01/13/2015  . Arthritis of knee 01/13/2015  . Fatigue due to sleep pattern disturbance 03/12/2014  . Nocturia more than twice per night 03/12/2014  . DM (diabetes mellitus) type 2, uncontrolled, with ketoacidosis (HCC) 03/12/2014  . OSA on CPAP 03/12/2013   Past Medical History  Diagnosis Date  . Hyperlipidemia   . Diabetes (HCC)   . Erectile dysfunction   . Osteoarthritis   . Disturbance of skin sensation   . OSA on CPAP   . Hypertension   . Headache   . Dizziness     Past Surgical History  Procedure Laterality Date  . Total knee arthroplasty Right 01/13/2015  . Total knee arthroplasty Right 01/13/2015    Procedure: TOTAL KNEE ARTHROPLASTY;  Surgeon: Gean BirchwoodFrank Rowan, MD;  [facility];  Service: Orthopedics;  Laterality: Right;    No prescriptions prior to admission   Allergies  Allergen Reactions  . Penicillins Other (See Comments)    Unspecified  Has patient had a PCN reaction causing immediate rash, facial/tongue/throat swelling, SOB or lightheadedness with hypotension: YES Has patient had a PCN reaction causing severe rash involving mucus membranes or skin necrosis: NO Has patient had a PCN reaction that required hospitalization NO Has patient had a PCN reaction occurring within the last 10 years: NO If all of the above answers are "NO", then may proceed with Cephalosporin use.      Social History  Substance Use Topics  . Smoking status: Former Smoker -- 20 years  . Smokeless tobacco: Never Used     Comment: 2003  . Alcohol Use: 0.0 oz/week     Comment: twice a week    Family History  Problem Relation Age of Onset  . Diabetes Father   . Prostate cancer Father   . Cancer Paternal Aunt   . Diabetes Sister      Review of Systems  Constitutional: Positive for fever, chills and diaphoresis.  HENT: Negative.   Eyes: Negative.   Respiratory: Negative.   Cardiovascular: Negative.   Gastrointestinal: Negative.   Genitourinary: Negative.   Musculoskeletal:       RA  Skin: Negative.   Neurological: Negative.   Endo/Heme/Allergies:       Blood sugar problem  Psychiatric/Behavioral: The patient is nervous/anxious.     Objective:  Physical Exam  Constitutional: He is oriented to person, place, and time. He appears well-developed  and well-nourished.  HENT:  Head: Normocephalic and atraumatic.  Eyes: Pupils are equal, round, and reactive to light.  Neck: Normal range of motion. Neck supple.  Cardiovascular: Intact distal pulses.   Respiratory: Effort normal.  Musculoskeletal: He exhibits tenderness.  the patient has no noticeable effusion on the right knee.  He has a range from 0-125.  No erythema or warmth.  His calves are soft and nontender.   Patient's left knee does have obvious crepitus with range of motion.  He has a range from 0-120.  Tenderness over the medial joint line.  No instability.  Neurological: He is alert and oriented to person, place, and time.  Skin: Skin is warm and dry.  Psychiatric: He has a normal mood and affect. His behavior is normal. Judgment and thought content normal.    Vital signs in last 24 hours:    Labs:   Estimated body mass index is 35.55 kg/(m^2) as calculated from the following:   Height as of 04/23/15:  (1.803 m).   Weight as of 04/23/15: 115.577 kg (254 lb 12.8 oz).   Imaging Review Plain radiographs demonstrate moderate degenerative joint disease of the left knee(s). The overall alignment ismild varus. The bone quality appears to be excellent for age and reported activity level.  Assessment/Plan:  End stage arthritis, left knee   The patient history, physical examination, clinical judgment of the provider and imaging studies are consistent with end stage degenerative joint disease of the left knee(s) and total knee arthroplasty is deemed medically necessary. The treatment options including medical management, injection therapy arthroscopy and arthroplasty were discussed at length. The risks and benefits of total knee arthroplasty were presented and reviewed. The risks due to aseptic loosening, infection, stiffness, patella tracking problems, thromboembolic complications and other imponderables were discussed. The patient acknowledged the explanation, agreed to proceed with the plan and consent was signed. Patient is being admitted for inpatient treatment for surgery, pain control, PT, OT, prophylactic antibiotics, VTE prophylaxis, progressive ambulation and ADL's and discharge planning. The patient is planning to be discharged home with home health services

## 2015-05-30 MED ORDER — DEXTROSE-NACL 5-0.45 % IV SOLN: INTRAVENOUS | Status: DC

## 2015-05-30 MED ORDER — VANCOMYCIN HCL 10 G IV SOLR
1500.0000 mg | INTRAVENOUS | Status: AC
  Administered 2015-05-31 (×2): 1500 mg via INTRAVENOUS
  Filled 2015-05-30: qty 1500

## 2015-05-30 MED ORDER — TRANEXAMIC ACID 1000 MG/10ML IV SOLN
15.0000 mg/kg | INTRAVENOUS | Status: AC
  Administered 2015-05-31: 1736 mg via INTRAVENOUS
  Filled 2015-05-30: qty 17.36

## 2015-05-30 MED ORDER — BUPIVACAINE LIPOSOME 1.3 % IJ SUSP
20.0000 mL | INTRAMUSCULAR | Status: AC
  Administered 2015-05-31: 20 mL
  Filled 2015-05-30: qty 20

## 2015-05-30 MED ORDER — TRANEXAMIC ACID 1000 MG/10ML IV SOLN
2000.0000 mg | INTRAVENOUS | Status: AC
  Administered 2015-05-31: 2000 mg via TOPICAL
  Filled 2015-05-30: qty 20

## 2015-05-31 ENCOUNTER — Encounter (HOSPITAL_COMMUNITY)
Admission: RE | Disposition: A | Discharge status: Home Health | Payer: Self-pay | Source: Ambulatory Visit | Attending: Orthopedic Surgery

## 2015-05-31 ENCOUNTER — Inpatient Hospital Stay (HOSPITAL_COMMUNITY): Payer: BLUE CROSS/BLUE SHIELD | Admitting: Certified Registered Nurse Anesthetist

## 2015-05-31 ENCOUNTER — Inpatient Hospital Stay (HOSPITAL_COMMUNITY)
Admission: RE | Admit: 2015-05-31 | Discharge: 2015-06-02 | DRG: 470 | Disposition: A | Discharge status: Home Health | Payer: BLUE CROSS/BLUE SHIELD | Source: Ambulatory Visit | Attending: Orthopedic Surgery | Admitting: Orthopedic Surgery

## 2015-05-31 ENCOUNTER — Encounter (HOSPITAL_COMMUNITY): Payer: Self-pay | Admitting: *Deleted

## 2015-05-31 DIAGNOSIS — Z87891 Personal history of nicotine dependence: Secondary | ICD-10-CM | POA: Diagnosis not present

## 2015-05-31 DIAGNOSIS — E785 Hyperlipidemia, unspecified: Secondary | ICD-10-CM | POA: Diagnosis present

## 2015-05-31 DIAGNOSIS — M1712 Unilateral primary osteoarthritis, left knee: Principal | ICD-10-CM | POA: Diagnosis present

## 2015-05-31 DIAGNOSIS — I10 Essential (primary) hypertension: Secondary | ICD-10-CM | POA: Diagnosis present

## 2015-05-31 DIAGNOSIS — Z880 Allergy status to penicillin: Secondary | ICD-10-CM

## 2015-05-31 DIAGNOSIS — E119 Type 2 diabetes mellitus without complications: Secondary | ICD-10-CM | POA: Diagnosis present

## 2015-05-31 DIAGNOSIS — G4733 Obstructive sleep apnea (adult) (pediatric): Secondary | ICD-10-CM | POA: Diagnosis present

## 2015-05-31 DIAGNOSIS — Z79899 Other long term (current) drug therapy: Secondary | ICD-10-CM | POA: Diagnosis not present

## 2015-05-31 DIAGNOSIS — Z96651 Presence of right artificial knee joint: Secondary | ICD-10-CM | POA: Diagnosis present

## 2015-05-31 DIAGNOSIS — D62 Acute posthemorrhagic anemia: Secondary | ICD-10-CM | POA: Diagnosis not present

## 2015-05-31 DIAGNOSIS — Z7984 Long term (current) use of oral hypoglycemic drugs: Secondary | ICD-10-CM | POA: Diagnosis not present

## 2015-05-31 HISTORY — PX: TOTAL KNEE ARTHROPLASTY: SHX125

## 2015-05-31 LAB — GLUCOSE, CAPILLARY
Glucose-Capillary: 161 mg/dL — ABNORMAL HIGH (ref 65–99)
Glucose-Capillary: 143 mg/dL — ABNORMAL HIGH (ref 65–99)

## 2015-05-31 SURGERY — ARTHROPLASTY, KNEE, TOTAL
Anesthesia: Spinal | Site: Knee | Laterality: Left

## 2015-05-31 MED ORDER — ASPIRIN EC 325 MG PO TBEC: 325.0000 mg | DELAYED_RELEASE_TABLET | Freq: Two times a day (BID) | ORAL | Status: AC

## 2015-05-31 MED ORDER — FAMOTIDINE 20 MG PO TABS
10.0000 mg | ORAL_TABLET | Freq: Two times a day (BID) | ORAL | Status: DC
  Administered 2015-06-01 – 2015-06-02 (×3): 10 mg via ORAL
  Filled 2015-05-31 (×3): qty 1

## 2015-05-31 MED ORDER — IRBESARTAN 300 MG PO TABS
300.0000 mg | ORAL_TABLET | Freq: Every day | ORAL | Status: DC
  Administered 2015-06-01 – 2015-06-02 (×2): 300 mg via ORAL
  Filled 2015-05-31 (×2): qty 1

## 2015-05-31 MED ORDER — DOCUSATE SODIUM 100 MG PO CAPS
100.0000 mg | ORAL_CAPSULE | Freq: Two times a day (BID) | ORAL | Status: DC
  Administered 2015-05-31 – 2015-06-02 (×4): 100 mg via ORAL
  Filled 2015-05-31 (×4): qty 1

## 2015-05-31 MED ORDER — TRANEXAMIC ACID 1000 MG/10ML IV SOLN
1000.0000 mg | Freq: Once | INTRAVENOUS | Status: AC
  Administered 2015-05-31: 1000 mg via INTRAVENOUS
  Filled 2015-05-31: qty 10

## 2015-05-31 MED ORDER — FENTANYL CITRATE (PF) 250 MCG/5ML IJ SOLN
INTRAMUSCULAR | Status: AC
  Filled 2015-05-31: qty 5

## 2015-05-31 MED ORDER — MIDAZOLAM HCL 2 MG/2ML IJ SOLN
INTRAMUSCULAR | Status: AC
  Filled 2015-05-31: qty 2

## 2015-05-31 MED ORDER — BUPIVACAINE HCL (PF) 0.25 % IJ SOLN
INTRAMUSCULAR | Status: AC
  Filled 2015-05-31: qty 30

## 2015-05-31 MED ORDER — BISACODYL 5 MG PO TBEC: 5.0000 mg | DELAYED_RELEASE_TABLET | Freq: Every day | ORAL | Status: DC | PRN

## 2015-05-31 MED ORDER — AMLODIPINE BESYLATE 5 MG PO TABS
5.0000 mg | ORAL_TABLET | Freq: Every day | ORAL | Status: DC
  Administered 2015-06-01 – 2015-06-02 (×2): 5 mg via ORAL
  Filled 2015-05-31 (×2): qty 1

## 2015-05-31 MED ORDER — HYDROMORPHONE HCL 1 MG/ML IJ SOLN: 0.2500 mg | INTRAMUSCULAR | Status: DC | PRN

## 2015-05-31 MED ORDER — METOCLOPRAMIDE HCL 5 MG/ML IJ SOLN: 5.0000 mg | Freq: Three times a day (TID) | INTRAMUSCULAR | Status: DC | PRN

## 2015-05-31 MED ORDER — SENNOSIDES-DOCUSATE SODIUM 8.6-50 MG PO TABS: 1.0000 | ORAL_TABLET | Freq: Every evening | ORAL | Status: DC | PRN

## 2015-05-31 MED ORDER — ACETAMINOPHEN 650 MG RE SUPP: 650.0000 mg | Freq: Four times a day (QID) | RECTAL | Status: DC | PRN

## 2015-05-31 MED ORDER — LACTATED RINGERS IV SOLN
INTRAVENOUS | Status: DC
  Administered 2015-05-31 (×3): via INTRAVENOUS

## 2015-05-31 MED ORDER — FENTANYL CITRATE (PF) 100 MCG/2ML IJ SOLN
INTRAMUSCULAR | Status: DC | PRN
  Administered 2015-05-31 (×2): 50 ug via INTRAVENOUS

## 2015-05-31 MED ORDER — TRIAMTERENE-HCTZ 37.5-25 MG PO CAPS
1.0000 | ORAL_CAPSULE | Freq: Every day | ORAL | Status: DC
  Administered 2015-06-01: 1 via ORAL
  Filled 2015-05-31 (×2): qty 1

## 2015-05-31 MED ORDER — SODIUM CHLORIDE 0.9 % IR SOLN
Status: DC | PRN
  Administered 2015-05-31: 3000 mL

## 2015-05-31 MED ORDER — 0.9 % SODIUM CHLORIDE (POUR BTL) OPTIME
TOPICAL | Status: DC | PRN
  Administered 2015-05-31: 1000 mL

## 2015-05-31 MED ORDER — SITAGLIPTIN PHOS-METFORMIN HCL 50-1000 MG PO TABS: 1.0000 | ORAL_TABLET | Freq: Every day | ORAL | Status: DC

## 2015-05-31 MED ORDER — AMLODIPINE-OLMESARTAN 5-40 MG PO TABS: 1.0000 | ORAL_TABLET | Freq: Every day | ORAL | Status: DC

## 2015-05-31 MED ORDER — METOCLOPRAMIDE HCL 5 MG PO TABS
5.0000 mg | ORAL_TABLET | Freq: Three times a day (TID) | ORAL | Status: DC | PRN
Start: 2015-05-31 — End: 2015-06-02

## 2015-05-31 MED ORDER — ONDANSETRON HCL 4 MG PO TABS: 4.0000 mg | ORAL_TABLET | Freq: Four times a day (QID) | ORAL | Status: DC | PRN

## 2015-05-31 MED ORDER — TRIAMCINOLONE ACETONIDE 55 MCG/ACT NA AERO: 1.0000 | INHALATION_SPRAY | Freq: Every day | NASAL | Status: DC | PRN

## 2015-05-31 MED ORDER — BUPIVACAINE IN DEXTROSE 0.75-8.25 % IT SOLN
INTRATHECAL | Status: DC | PRN
  Administered 2015-05-31: 2 mL via INTRATHECAL

## 2015-05-31 MED ORDER — ALUM & MAG HYDROXIDE-SIMETH 200-200-20 MG/5ML PO SUSP: 30.0000 mL | ORAL | Status: DC | PRN

## 2015-05-31 MED ORDER — PHENYLEPHRINE HCL 10 MG/ML IJ SOLN
INTRAMUSCULAR | Status: DC | PRN
  Administered 2015-05-31 (×3): 40 ug via INTRAVENOUS

## 2015-05-31 MED ORDER — DIPHENHYDRAMINE HCL 12.5 MG/5ML PO ELIX: 12.5000 mg | ORAL_SOLUTION | ORAL | Status: DC | PRN

## 2015-05-31 MED ORDER — LINAGLIPTIN 5 MG PO TABS
5.0000 mg | ORAL_TABLET | Freq: Every day | ORAL | Status: DC
  Administered 2015-06-01 – 2015-06-02 (×2): 5 mg via ORAL
  Filled 2015-05-31 (×2): qty 1

## 2015-05-31 MED ORDER — BUPIVACAINE HCL (PF) 0.5 % IJ SOLN
INTRAMUSCULAR | Status: AC
  Filled 2015-05-31: qty 30

## 2015-05-31 MED ORDER — ONDANSETRON HCL 4 MG/2ML IJ SOLN: 4.0000 mg | Freq: Once | INTRAMUSCULAR | Status: DC | PRN

## 2015-05-31 MED ORDER — BUPIVACAINE HCL 0.5 % IJ SOLN
INTRAMUSCULAR | Status: DC | PRN
  Administered 2015-05-31: 20 mL

## 2015-05-31 MED ORDER — PHENOL 1.4 % MT LIQD: 1.0000 | OROMUCOSAL | Status: DC | PRN

## 2015-05-31 MED ORDER — MENTHOL 3 MG MT LOZG: 1.0000 | LOZENGE | OROMUCOSAL | Status: DC | PRN

## 2015-05-31 MED ORDER — PROPOFOL 10 MG/ML IV BOLUS
INTRAVENOUS | Status: DC | PRN
  Administered 2015-05-31 (×3): 20 mg via INTRAVENOUS
  Administered 2015-05-31: 10 mg via INTRAVENOUS

## 2015-05-31 MED ORDER — HYDROMORPHONE HCL 1 MG/ML IJ SOLN
0.5000 mg | INTRAMUSCULAR | Status: DC | PRN
Start: 2015-05-31 — End: 2015-06-02
  Administered 2015-05-31: 1 mg via INTRAVENOUS
  Filled 2015-05-31: qty 1

## 2015-05-31 MED ORDER — ASPIRIN EC 325 MG PO TBEC
325.0000 mg | DELAYED_RELEASE_TABLET | Freq: Every day | ORAL | Status: DC
  Administered 2015-06-01 – 2015-06-02 (×2): 325 mg via ORAL
  Filled 2015-05-31 (×2): qty 1

## 2015-05-31 MED ORDER — ACETAMINOPHEN 325 MG PO TABS: 650.0000 mg | ORAL_TABLET | Freq: Four times a day (QID) | ORAL | Status: DC | PRN

## 2015-05-31 MED ORDER — METFORMIN HCL 500 MG PO TABS
1000.0000 mg | ORAL_TABLET | Freq: Every day | ORAL | Status: DC
  Administered 2015-06-01: 1000 mg via ORAL
  Filled 2015-05-31: qty 2

## 2015-05-31 MED ORDER — INSULIN ASPART 100 UNIT/ML ~~LOC~~ SOLN
0.0000 [IU] | Freq: Three times a day (TID) | SUBCUTANEOUS | Status: DC
Start: 2015-05-31 — End: 2015-06-02
  Administered 2015-06-01: 3 [IU] via SUBCUTANEOUS
  Administered 2015-06-01: 5 [IU] via SUBCUTANEOUS
  Administered 2015-06-01 – 2015-06-02 (×3): 3 [IU] via SUBCUTANEOUS

## 2015-05-31 MED ORDER — OXYCODONE HCL 5 MG PO TABS
5.0000 mg | ORAL_TABLET | ORAL | Status: DC | PRN
Start: 2015-05-31 — End: 2015-06-02
  Administered 2015-05-31 – 2015-06-02 (×9): 10 mg via ORAL
  Filled 2015-05-31 (×10): qty 2

## 2015-05-31 MED ORDER — MIDAZOLAM HCL 5 MG/5ML IJ SOLN
INTRAMUSCULAR | Status: DC | PRN
  Administered 2015-05-31 (×2): 1 mg via INTRAVENOUS

## 2015-05-31 MED ORDER — ONDANSETRON HCL 4 MG/2ML IJ SOLN: 4.0000 mg | Freq: Four times a day (QID) | INTRAMUSCULAR | Status: DC | PRN

## 2015-05-31 MED ORDER — DEXTROSE 5 % IV SOLN
500.0000 mg | Freq: Four times a day (QID) | INTRAVENOUS | Status: DC | PRN
  Filled 2015-05-31: qty 5

## 2015-05-31 MED ORDER — PROPOFOL 500 MG/50ML IV EMUL
INTRAVENOUS | Status: DC | PRN
  Administered 2015-05-31: 70 ug/kg/min via INTRAVENOUS

## 2015-05-31 MED ORDER — CHLORHEXIDINE GLUCONATE 4 % EX LIQD: 60.0000 mL | Freq: Once | CUTANEOUS | Status: DC

## 2015-05-31 MED ORDER — FLEET ENEMA 7-19 GM/118ML RE ENEM: 1.0000 | ENEMA | Freq: Once | RECTAL | Status: DC | PRN

## 2015-05-31 MED ORDER — PROPOFOL 10 MG/ML IV BOLUS
INTRAVENOUS | Status: AC
  Filled 2015-05-31: qty 20

## 2015-05-31 MED ORDER — PHENYLEPHRINE HCL 10 MG/ML IJ SOLN
10.0000 mg | INTRAVENOUS | Status: DC | PRN
  Administered 2015-05-31: 15 ug/min via INTRAVENOUS

## 2015-05-31 MED ORDER — PRAVASTATIN SODIUM 20 MG PO TABS
20.0000 mg | ORAL_TABLET | Freq: Every day | ORAL | Status: DC
  Administered 2015-05-31 – 2015-06-02 (×3): 20 mg via ORAL
  Filled 2015-05-31 (×3): qty 1

## 2015-05-31 MED ORDER — KCL IN DEXTROSE-NACL 20-5-0.45 MEQ/L-%-% IV SOLN
INTRAVENOUS | Status: DC
  Administered 2015-05-31 – 2015-06-01 (×2): via INTRAVENOUS
  Filled 2015-05-31 (×3): qty 1000

## 2015-05-31 MED ORDER — METHOCARBAMOL 500 MG PO TABS: 500.0000 mg | ORAL_TABLET | Freq: Two times a day (BID) | ORAL | Status: AC

## 2015-05-31 MED ORDER — SODIUM CHLORIDE 0.9 % IJ SOLN
INTRAMUSCULAR | Status: DC | PRN
  Administered 2015-05-31: 20 mL

## 2015-05-31 MED ORDER — METHOCARBAMOL 500 MG PO TABS
500.0000 mg | ORAL_TABLET | Freq: Four times a day (QID) | ORAL | Status: DC | PRN
  Administered 2015-05-31 – 2015-06-02 (×5): 500 mg via ORAL
  Filled 2015-05-31 (×6): qty 1

## 2015-05-31 MED ORDER — ONDANSETRON HCL 4 MG/2ML IJ SOLN
INTRAMUSCULAR | Status: DC | PRN
  Administered 2015-05-31: 4 mg via INTRAVENOUS

## 2015-05-31 MED ORDER — PROPOFOL 1000 MG/100ML IV EMUL
INTRAVENOUS | Status: AC
  Filled 2015-05-31: qty 100

## 2015-05-31 MED ORDER — OXYCODONE-ACETAMINOPHEN 5-325 MG PO TABS: 1.0000 | ORAL_TABLET | ORAL | Status: DC | PRN

## 2015-05-31 MED ORDER — MEPERIDINE HCL 25 MG/ML IJ SOLN: 6.2500 mg | INTRAMUSCULAR | Status: DC | PRN

## 2015-05-31 SURGICAL SUPPLY — 56 items
BANDAGE ACE 6X5 VEL STRL LF (GAUZE/BANDAGES/DRESSINGS) ×3 IMPLANT
BANDAGE ESMARK 6X9 LF (GAUZE/BANDAGES/DRESSINGS) ×1 IMPLANT
BLADE SAG 18X100X1.27 (BLADE) ×3 IMPLANT
BLADE SAW SGTL 13X75X1.27 (BLADE) ×3 IMPLANT
BLADE SURG ROTATE 9660 (MISCELLANEOUS) IMPLANT
BNDG ELASTIC 6X10 VLCR STRL LF (GAUZE/BANDAGES/DRESSINGS) ×3 IMPLANT
BNDG ESMARK 6X9 LF (GAUZE/BANDAGES/DRESSINGS) ×3
BOWL SMART MIX CTS (DISPOSABLE) ×3 IMPLANT
CAPT KNEE TOTAL 3 ATTUNE ×3 IMPLANT
CEMENT HV SMART SET (Cement) ×6 IMPLANT
COVER SURGICAL LIGHT HANDLE (MISCELLANEOUS) ×3 IMPLANT
CUFF TOURNIQUET SINGLE 34IN LL (TOURNIQUET CUFF) ×3 IMPLANT
CUFF TOURNIQUET SINGLE 44IN (TOURNIQUET CUFF) IMPLANT
DRAPE EXTREMITY T 121X128X90 (DRAPE) ×3 IMPLANT
DRAPE U-SHAPE 47X51 STRL (DRAPES) ×3 IMPLANT
DRESSING AQUACEL AG SP 3.5X10 (GAUZE/BANDAGES/DRESSINGS) ×1 IMPLANT
DRSG AQUACEL AG ADV 3.5X10 (GAUZE/BANDAGES/DRESSINGS) ×3 IMPLANT
DRSG AQUACEL AG SP 3.5X10 (GAUZE/BANDAGES/DRESSINGS) ×3
DURAPREP 26ML APPLICATOR (WOUND CARE) ×6 IMPLANT
ELECT REM PT RETURN 9FT ADLT (ELECTROSURGICAL) ×3
ELECTRODE REM PT RTRN 9FT ADLT (ELECTROSURGICAL) ×1 IMPLANT
EVACUATOR 1/8 PVC DRAIN (DRAIN) IMPLANT
GLOVE BIO SURGEON STRL SZ7.5 (GLOVE) ×3 IMPLANT
GLOVE BIO SURGEON STRL SZ8.5 (GLOVE) ×3 IMPLANT
GLOVE BIOGEL PI IND STRL 8 (GLOVE) ×1 IMPLANT
GLOVE BIOGEL PI IND STRL 9 (GLOVE) ×1 IMPLANT
GLOVE BIOGEL PI INDICATOR 8 (GLOVE) ×2
GLOVE BIOGEL PI INDICATOR 9 (GLOVE) ×2
GLOVE SURG ORTHO 7.0 STRL STRW (GLOVE) ×3 IMPLANT
GOWN STRL REUS W/ TWL LRG LVL3 (GOWN DISPOSABLE) ×1 IMPLANT
GOWN STRL REUS W/ TWL XL LVL3 (GOWN DISPOSABLE) ×2 IMPLANT
GOWN STRL REUS W/TWL LRG LVL3 (GOWN DISPOSABLE) ×2
GOWN STRL REUS W/TWL XL LVL3 (GOWN DISPOSABLE) ×4
HANDPIECE INTERPULSE COAX TIP (DISPOSABLE) ×2
HOOD PEEL AWAY FACE SHEILD DIS (HOOD) ×6 IMPLANT
KIT BASIN OR (CUSTOM PROCEDURE TRAY) ×3 IMPLANT
KIT ROOM TURNOVER OR (KITS) ×3 IMPLANT
MANIFOLD NEPTUNE II (INSTRUMENTS) ×3 IMPLANT
NEEDLE SPNL 18GX3.5 QUINCKE PK (NEEDLE) ×3 IMPLANT
NS IRRIG 1000ML POUR BTL (IV SOLUTION) ×3 IMPLANT
PACK TOTAL JOINT (CUSTOM PROCEDURE TRAY) ×3 IMPLANT
PAD ARMBOARD 7.5X6 YLW CONV (MISCELLANEOUS) ×6 IMPLANT
SET HNDPC FAN SPRY TIP SCT (DISPOSABLE) ×1 IMPLANT
SUT VIC AB 0 CT1 27 (SUTURE) ×2
SUT VIC AB 0 CT1 27XBRD ANBCTR (SUTURE) ×1 IMPLANT
SUT VIC AB 1 CTX 36 (SUTURE) ×2
SUT VIC AB 1 CTX36XBRD ANBCTR (SUTURE) ×1 IMPLANT
SUT VIC AB 2-0 CT1 27 (SUTURE) ×2
SUT VIC AB 2-0 CT1 TAPERPNT 27 (SUTURE) ×1 IMPLANT
SUT VIC AB 3-0 CT1 27 (SUTURE) ×2
SUT VIC AB 3-0 CT1 TAPERPNT 27 (SUTURE) ×1 IMPLANT
SYR 50ML LL SCALE MARK (SYRINGE) ×3 IMPLANT
TOWEL OR 17X24 6PK STRL BLUE (TOWEL DISPOSABLE) ×3 IMPLANT
TOWEL OR 17X26 10 PK STRL BLUE (TOWEL DISPOSABLE) ×3 IMPLANT
TRAY CATH 16FR W/PLASTIC CATH (SET/KITS/TRAYS/PACK) IMPLANT
WATER STERILE IRR 1000ML POUR (IV SOLUTION) IMPLANT

## 2015-05-31 NOTE — Op Note (Signed)
PATIENT ID:      Darin Conner  MRN:     [id number] DOB/AGE:    February 17, 1955 / 60 y.o.       OPERATIVE REPORT    DATE OF PROCEDURE:  05/31/2015       PREOPERATIVE DIAGNOSIS:   LEFT KNEE OSTEOARTHRITIS      Estimated body mass index is 35.07 kg/(m^2) as calculated from the following:   Height as of this encounter: 5' 11.5" (1.816 m).   Weight as of this encounter: 115.667 kg (255 lb).                                                        POSTOPERATIVE DIAGNOSIS:   LEFT KNEE OSTEOARTHRITIS                                                                      PROCEDURE:  Procedure(s): TOTAL LEFT KNEE ARTHROPLASTY Using DepuyAttune RP implants #9L Femur, #8Tibia, 6 mm Attune RP bearing, 38 Patella     SURGEON: Karry Barrilleaux J    ASSISTANT:   Eric K. Gaylene Brooks   (Present and scrubbed throughout the case, critical for assistance with exposure, retraction, instrumentation, and closure.)         ANESTHESIA: Spinal, 20cc Exparel, 20cc 0.5% Marcaine  EBL: 300  FLUID REPLACEMENT: 2000 crystalloid  TOURNIQUET TIME:  Drains: None  Tranexamic Acid: 1gm iv, 2gm topical   COMPLICATIONS:  None         INDICATIONS FOR PROCEDURE: The patient has  LEFT KNEE OSTEOARTHRITIS, Var deformities, XR shows bone on bone arthritis, lateral subluxation of tibia. Patient has failed all conservative measures including anti-inflammatory medicines, narcotics, attempts at  exercise and weight loss, cortisone injections and viscosupplementation.  Risks and benefits of surgery have been discussed, questions answered.   DESCRIPTION OF PROCEDURE: The patient identified by armband, received  IV antibiotics, in the holding area at [facility]. Patient taken to the operating room, appropriate anesthetic  monitors were attached, and Spinal anesthesia was  induced. Tourniquet  applied high to the operative thigh. Lateral post and foot positioner  applied to the table, the lower extremity was then prepped and draped   in usual sterile fashion from the toes to the tourniquet. Time-out procedure was performed. We began the operation, with the knee flexed 120 degrees, by making the anterior midline incision starting at handbreadth above the patella going over the patella 1 cm medial to and 4 cm distal to the tibial tubercle. Small bleeders in the skin and the  subcutaneous tissue identified and cauterized. Transverse retinaculum was incised and reflected medially and a medial parapatellar arthrotomy was accomplished. the patella was everted and theprepatellar fat pad resected. The superficial medial collateral  ligament was then elevated from anterior to posterior along the proximal  flare of the tibia and anterior half of the menisci resected. The knee was hyperflexed exposing bone on bone arthritis. Peripheral and notch osteophytes as well as the cruciate ligaments were then resected. We continued to  work our way around posteriorly along the proximal  tibia, and externally  rotated the tibia subluxing it out from underneath the femur. A McHale  retractor was placed through the notch and a lateral Hohmann retractor  placed, and we then drilled through the proximal tibia in line with the  axis of the tibia followed by an intramedullary guide rod and 2-degree  posterior slope cutting guide. The tibial cutting guide, 3 degree posterior sloped, was pinned into place allowing resection of 4 mm of bone medially and 9 mm of bone laterally. Satisfied with the tibial resection, we then  entered the distal femur 2 mm anterior to the PCL origin with the  intramedullary guide rod and applied the distal femoral cutting guide  set at 9 mm, with 5 degrees of valgus. This was pinned along the  epicondylar axis. At this point, the distal femoral cut was accomplished without difficulty. We then sized for a #9L femoral component and pinned the guide in 3 degrees of external rotation. The chamfer cutting guide was pinned into place. The  anterior, posterior, and chamfer cuts were accomplished without difficulty followed by  the Attune RP box cutting guide and the box cut. We also removed posterior osteophytes from the posterior femoral condyles. At this  time, the knee was brought into full extension. We checked our  extension and flexion gaps and found them symmetric for a 6 mm bearing. Distracting in extension with a lamina spreader, the posterior horns of the menisci were removed, and Exparel, diluted to 60 cc, with 20cc NS, and 20cc 0.5% Marcaine,was injected into the capsule and synovium of the knee. The posterior patella cut was accomplished with the 9.5 mm Attune cutting guide, sized for a 38mm dome, and the fixation pegs drilled.The knee  was then once again hyperflexed exposing the proximal tibia. We sized for a # 8 tibial base plate, applied the smokestack and the conical reamer followed by the the Delta fin keel punch. We then hammered into place the Attune RP trial femoral component, drilled the lugs, inserted a  6 mm trial bearing, trial patellar button, and took the knee through range of motion from 0-130 degrees. No thumb pressure was required for patellar Tracking. At this point, the limb was wrapped with an Esmarch bandage and the tourniquet inflated to 350 mmHg. All trial components were removed, mating surfaces irrigated with pulse lavage, and dried with suction and sponges. A double batch of DePuy HV cement with 1500 mg of Zinacef was mixed and applied to all bony metallic mating surfaces except for the posterior condyles of the femur itself. In order, we  hammered into place the tibial tray and removed excess cement, the femoral component and removed excess cement. The final Attune RP bearing  was inserted, and the knee brought to full extension with compression.  The patellar button was clamped into place, and excess cement  removed. While the cement cured the wound was irrigated out with normal saline solution pulse  lavage. Ligament stability and patellar tracking were checked and found to be excellent. The parapatellar arthrotomy was closed with  running #1 Vicryl suture. The subcutaneous tissue with 0 and 2-0 undyed  Vicryl suture, and the skin with running 3-0 SQ vicryl. A dressing of Xeroform,  4 x 4, dressing sponges, Webril, and Ace wrap applied. The patient  awakened, and taken to recovery room without difficulty.   Gean BirchwoodOWAN,Ariadna Setter J 05/31/2015, 12:54 PM

## 2015-05-31 NOTE — Progress Notes (Signed)
Orthopedic Tech Progress Note Patient Details:  Leandrew KoyanagiBill Turan February 13, 1955 [id number]  CPM Left Knee CPM Left Knee: On Left Knee Flexion (Degrees): 40 Left Knee Extension (Degrees): 10 Additional Comments: Trapeze bar and foot roll   Saul FordyceJennifer C Tiago Humphrey 05/31/2015, 2:20 PM

## 2015-05-31 NOTE — Anesthesia Postprocedure Evaluation (Signed)
Anesthesia Post Note  Patient: Darin Conner  Procedure(s) Performed: Procedure(s) (LRB): TOTAL LEFT KNEE ARTHROPLASTY (Left)  Patient location during evaluation: PACU Anesthesia Type: Spinal and MAC Level of consciousness: awake and alert Pain management: pain level controlled Vital Signs Assessment: post-procedure vital signs reviewed and stable Respiratory status: spontaneous breathing and respiratory function stable Cardiovascular status: blood pressure returned to baseline and stable Postop Assessment: spinal receding Anesthetic complications: no    Last Vitals:  Filed Vitals:   05/31/15 1454 05/31/15 1548  BP: 114/78 125/93  Pulse: 69 67  Temp: 36.8 C 36.6 C  Resp: 20 18    Last Pain:  Filed Vitals:   05/31/15 1549  PainSc: 0-No pain            L Sensory Level: S1-Sole of foot, small toes (05/31/15 1540) R Sensory Level: S1-Sole of foot, small toes (05/31/15 1540)  Aviya Jarvie DAVID

## 2015-05-31 NOTE — Transfer of Care (Signed)
Immediate Anesthesia Transfer of Care Note  Patient: Darin Conner  Procedure(s) Performed: Procedure(s): TOTAL LEFT KNEE ARTHROPLASTY (Left)  Patient Location: PACU  Anesthesia Type:MAC and Spinal  Level of Consciousness: awake, alert  and oriented  Airway & Oxygen Therapy: Patient Spontanous Breathing  Post-op Assessment: Report given to RN and Post -op Vital signs reviewed and stable  Post vital signs: Reviewed and stable  Last Vitals:  Filed Vitals:   05/31/15 0931 05/31/15 1328  BP: 142/82 98/52  Pulse: 76 75  Temp: 36.7 C   Resp: 20     Last Pain:  Filed Vitals:   05/31/15 1333  PainSc: 5       Patients Stated Pain Goal: 4 (05/31/15 0943)  Complications: No apparent anesthesia complications

## 2015-05-31 NOTE — Discharge Instructions (Signed)

## 2015-05-31 NOTE — Anesthesia Preprocedure Evaluation (Signed)
Anesthesia Evaluation  Patient identified by MRN, date of birth, ID band Patient awake    Reviewed: Allergy & Precautions, NPO status , Patient's Chart, lab work & pertinent test results  Airway Mallampati: II  TM Distance: >3 FB Neck ROM: Full    Dental   Pulmonary sleep apnea , former smoker,    Pulmonary exam normal        Cardiovascular hypertension, Pt. on medications Normal cardiovascular exam     Neuro/Psych    GI/Hepatic   Endo/Other  diabetes, Type 2, Oral Hypoglycemic Agents  Renal/GU      Musculoskeletal   Abdominal   Peds  Hematology   Anesthesia Other Findings   Reproductive/Obstetrics                             Anesthesia Physical Anesthesia Plan  ASA: II  Anesthesia Plan: Spinal   Post-op Pain Management:    Induction: Intravenous  Airway Management Planned: Natural Airway  Additional Equipment:   Intra-op Plan:   Post-operative Plan: Extubation in OR  Informed Consent: I have reviewed the patients History and Physical, chart, labs and discussed the procedure including the risks, benefits and alternatives for the proposed anesthesia with the patient or authorized representative who has indicated his/her understanding and acceptance.     Plan Discussed with: CRNA and Surgeon  Anesthesia Plan Comments:         Anesthesia Quick Evaluation

## 2015-05-31 NOTE — Interval H&P Note (Signed)
History and Physical Interval Note:  05/31/2015 10:38 AM  [name]  has presented today for surgery, with the diagnosis of LEFT KNEE OSTEOARTHRITIS  The various methods of treatment have been discussed with the patient and family. After consideration of risks, benefits and other options for treatment, the patient has consented to  Procedure(s): TOTAL LEFT KNEE ARTHROPLASTY (Left) as a surgical intervention .  The patient's history has been reviewed, patient examined, no change in status, stable for surgery.  I have reviewed the patient's chart and labs.  Questions were answered to the patient's satisfaction.     Nestor LewandowskyOWAN,Demya Scruggs J

## 2015-05-31 NOTE — Anesthesia Procedure Notes (Signed)
Spinal Patient location during procedure: OR Start time: 05/31/2015 11:40 AM End time: 05/31/2015 11:46 AM Staffing Anesthesiologist: Arta BruceSSEY, Maryhelen Lindler Performed by: anesthesiologist  Preanesthetic Checklist Completed: patient identified, site marked, surgical consent, pre-op evaluation, timeout performed, IV checked, risks and benefits discussed and monitors and equipment checked Spinal Block Patient position: sitting Prep: Betadine Patient monitoring: heart rate, cardiac monitor, continuous pulse ox and blood pressure Approach: right paramedian Location: L3-4 Injection technique: single-shot Needle Needle type: Pencan  Needle gauge: 24 G Needle length: 9 cm Needle insertion depth: 9 cm

## 2015-06-01 ENCOUNTER — Encounter (HOSPITAL_COMMUNITY): Payer: Self-pay | Admitting: Orthopedic Surgery

## 2015-06-01 LAB — CBC
HCT: 32.9 % — ABNORMAL LOW (ref 39.0–52.0)
Hemoglobin: 11.1 g/dL — ABNORMAL LOW (ref 13.0–17.0)
MCH: 27.3 pg (ref 26.0–34.0)
MCHC: 33.7 g/dL (ref 30.0–36.0)
MCV: 80.8 fL (ref 78.0–100.0)
PLATELETS: 151 10*3/uL (ref 150–400)
RBC: 4.07 MIL/uL — AB (ref 4.22–5.81)
RDW: 13.2 % (ref 11.5–15.5)
WBC: 9.1 10*3/uL (ref 4.0–10.5)

## 2015-06-01 LAB — GLUCOSE, CAPILLARY
GLUCOSE-CAPILLARY: 207 mg/dL — AB (ref 65–99)
GLUCOSE-CAPILLARY: 150 mg/dL — AB (ref 65–99)
Glucose-Capillary: 189 mg/dL — ABNORMAL HIGH (ref 65–99)
Glucose-Capillary: 169 mg/dL — ABNORMAL HIGH (ref 65–99)
Glucose-Capillary: 163 mg/dL — ABNORMAL HIGH (ref 65–99)

## 2015-06-01 LAB — BASIC METABOLIC PANEL
Anion gap: 9 (ref 5–15)
BUN: 12 mg/dL (ref 6–20)
CHLORIDE: 105 mmol/L (ref 101–111)
CO2: 22 mmol/L (ref 22–32)
Calcium: 8.8 mg/dL — ABNORMAL LOW (ref 8.9–10.3)
Creatinine, Ser: 1.07 mg/dL (ref 0.61–1.24)
GFR calc Af Amer: >60 mL/min (ref >60)
GFR calc non Af Amer: >60 mL/min (ref >60)
GLUCOSE: 189 mg/dL — AB (ref 65–99)
POTASSIUM: 3.7 mmol/L (ref 3.5–5.1)
SODIUM: 136 mmol/L (ref 135–145)

## 2015-06-01 LAB — HEMOGLOBIN A1C
HEMOGLOBIN A1C: 6.3 % — AB (ref 4.8–5.6)
Mean Plasma Glucose: 134 mg/dL

## 2015-06-01 NOTE — Clinical Social Work Placement (Signed)
   CLINICAL SOCIAL WORK PLACEMENT  NOTE  Date:  06/01/2015  Patient Details  Name: Darin KoyanagiBill Conner MRN: [id number] Date of Birth: 09-16-1955  Clinical Social Work is seeking post-discharge placement for this patient at the Skilled  Nursing Facility level of care (*CSW will initial, date and re-position this form in  chart as items are completed):  Yes   Patient/family provided with [facility] Clinical Social Work Department's list of facilities offering this level of care within the geographic area requested by the patient (or if unable, by the patient's family).  Yes   Patient/family informed of their freedom to choose among providers that offer the needed level of care, that participate in Medicare, Medicaid or managed care program needed by the patient, have an available bed and are willing to accept the patient.  Yes   Patient/family informed of [facility]'s ownership interest in [facility] and [facility], as well as of the fact that they are under no obligation to receive care at these facilities.  PASRR submitted to EDS on 06/01/15     PASRR number received on 06/01/15     Existing PASRR number confirmed on       FL2 transmitted to all facilities in geographic area requested by pt/family on 06/01/15     FL2 transmitted to all facilities within larger geographic area on       Patient informed that his/her managed care company has contracts with or will negotiate with certain facilities, including the following:            Patient/family informed of bed offers received.  Patient chooses bed at       Physician [facility] and [facility]    Patient to be transferred to   on  .  Patient to be transferred to facility by       Patient family notified on   of transfer.  Name of family member notified:  patient is alert and oriented and agreeable to update wife at time of discharge     PHYSICIAN Please sign FL2     Additional Comment:     _______________________________________________ Rondel BatonIngle, Jossalin Chervenak C, LCSW 06/01/2015, 11:08 AM

## 2015-06-01 NOTE — Progress Notes (Signed)
Utilization review completed.  

## 2015-06-01 NOTE — Progress Notes (Signed)
Occupational Therapy Evaluation Patient Details Name: Darin KoyanagiBill Conner MRN: [id number] DOB: October 27, 1955 Today's Date: 06/01/2015    History of Present Illness 60 y/o male s/p L TKA on 05/31/15 with PMH of DM, HTN, HA, and dizziness.   Clinical Impression   PTA, pt independent with ADL and mobility. Pt currently requires min A for mobility and LB ADL. Pt plans [facility] prior to returning home. Feel pt will benefit from short rehab stay at SNF to reach mod I level goals. Will defer further OT to SNF.     Follow Up Recommendations  SNF;Supervision/Assistance - 24 hour    Equipment Recommendations  None recommended by OT    Recommendations for Other Services       Precautions / Restrictions Precautions Precautions: Knee Precaution Comments: reviewed use of Zero foam and no pillows under knee Restrictions Weight Bearing Restrictions: Yes LLE Weight Bearing: Weight bearing as tolerated      Mobility Bed Mobility Overal bed mobility: Needs Assistance Bed Mobility: Sit to Supine     Sit to supine: Min assist   General bed mobility comments: A to lift LLE onto bed  Transfers Overall transfer level: Needs assistance Equipment used: Rolling walker (2 wheeled) Transfers: Sit to/from [employer] CorporationStand;Stand Pivot Transfers Sit to Stand: Supervision Stand pivot transfers: Min guard       General transfer comment: MIN A wtih cues for hand placement    Balance Overall balance assessment: Needs assistance   Sitting balance-Leahy Scale: Good       Standing balance-Leahy Scale: Fair Standing balance comment: requires RW                             ADL Overall ADL's : Needs assistance/impaired     Grooming: Set up;Sitting   Upper Body Bathing: Set up;Sitting   Lower Body Bathing: Minimal assistance;Sit to/from stand   Upper Body Dressing : Set up;Sitting   Lower Body Dressing: Minimal assistance;Sit to/from stand   Toilet Transfer: Min  guard;RW   Toileting- ArchitectClothing Manipulation and Hygiene: Supervision/safety;Sit to/from stand       Functional mobility during ADLs: Min guard;Rolling walker;Cueing for safety General ADL Comments: Pt taught how to use sheet to assist wtih mving LLE for bed mobility     Vision     Perception     Praxis      Pertinent Vitals/Pain Pain Assessment: 0-10 Pain Score: 3  Pain Location: L knee Pain Descriptors / Indicators: Aching Pain Intervention(s): Limited activity within patient's tolerance;Ice applied;Repositioned     Hand Dominance Right   Extremity/Trunk Assessment Upper Extremity Assessment Upper Extremity Assessment: Overall WFL for tasks assessed   Lower Extremity Assessment Lower Extremity Assessment: Defer to PT evaluation LLE Deficits / Details: L knee AAROM ~ 60 degrees LLE: Unable to fully assess due to pain   Cervical / Trunk Assessment Cervical / Trunk Assessment: Normal   Communication Communication Communication: No difficulties   Cognition Arousal/Alertness: Awake/alert Behavior During Therapy: WFL for tasks assessed/performed Overall Cognitive Status: Within Functional Limits for tasks assessed                     General Comments       Exercises       Shoulder Instructions      Home Living Family/patient expects to be discharged to:: Skilled nursing facility  Prior Functioning/Environment Level of Independence: Independent             OT Diagnosis: Generalized weakness;Acute pain   OT Problem List: Decreased strength;Decreased range of motion;Decreased activity tolerance;Impaired balance (sitting and/or standing);Decreased safety awareness;Decreased knowledge of use of DME or AE;Decreased knowledge of precautions;Pain   OT Treatment/Interventions:      OT Goals(Current goals can be found in the care plan section) Acute Rehab OT Goals Patient Stated Goal: to go to  [facility] for rehab OT Goal Formulation: All assessment and education complete, DC therapy (defer further OT to SNF)  OT Frequency:     Barriers to D/C:            Co-evaluation              End of Session Equipment Utilized During Treatment: Rolling walker CPM Left Knee CPM Left Knee: Off Additional Comments: off at 9:00 Nurse Communication: Mobility status  Activity Tolerance: Patient tolerated treatment well Patient left: in bed;with call bell/phone within reach;with bed alarm set;with SCD's reapplied   Time: 1240-1305 OT Time Calculation (min): 25 min Charges:  OT General Charges $OT Visit: 1 Procedure OT Evaluation $OT Eval Low Complexity: 1 Procedure OT Treatments $Self Care/Home Management : 8-22 mins G-Codes:    Darin Conner,Darin Conner 2015/06/04, 1:13 PM   [facility], OTR/L  [phone] 06/04/2015

## 2015-06-01 NOTE — Clinical Social Work Note (Signed)
Clinical Social Work Assessment  Patient Details  Name: Darin Conner MRN: [id number] Date of Birth: 03/05/1955  Date of referral:  06/01/15               Reason for consult:  Facility Placement                Permission sought to share information with:  Facility Contact Representative Permission granted to share information::  Yes, Verbal Permission Granted  Name::        Agency::  [place] [facility] with preference of [facility]  Relationship::     Contact Information:     Housing/Transportation Living arrangements for the past 2 months:  Single Family Home Source of Information:  Patient Patient Interpreter Needed:  None Criminal Activity/Legal Involvement Pertinent to Current Situation/Hospitalization:  No - Comment as needed Significant Relationships:    Lives with:  Spouse Do you feel safe going back to the place where you live?  Yes Need for family participation in patient care:  No (Coment)  Care giving concerns:  No caregiver present at time of the assessment.   Social Worker assessment / plan:  CSW met with patient to review disposition.  Patient states that he does not feel safe returning home and that he does not have any assistance at home though he is from home with wife.  Patient states she is not able to physically assist him at time of discharge.  PT and OT are both pending at this time.  MD has spoken with patient about SNF/STR at time of discharge and has recommended [facility]-which is where the patient wishes to go.  CSW explained insurance authorization process and the possibility of insurance denial.  CSW encouraged patient to be thinking about what he would do if the insurance denied STR at SNF.  Patient is agreeable to entertain back-up option to SNF.  CSW will send referrals to [place] SNFs and present bed offers once available.  Employment status:    Insurance information:    PT Recommendations:  Skilled Nursing Facility Information / Referral  to community resources:  Skilled Nursing Facility  Patient/Family's Response to care:  Patient is agreeable to SNF  Patient/Family's Understanding of and Emotional Response to Diagnosis, Current Treatment, and Prognosis:  Patient expressed frustration regarding his inability to possibly return home.  Patient states he doesn't have reservations regarding STR at SNF, but wishes that he didn't have to go or that he had this surgery.  CSW offered support.  Emotional Assessment Appearance:  Appears older than stated age Attitude/Demeanor/Rapport:    Affect (typically observed):  Accepting, Adaptable Orientation:  Oriented to Self, Oriented to Place, Oriented to  Time, Oriented to Situation Alcohol / Substance use:  Not Applicable Psych involvement (Current and /or in the community):  No (Comment)  Discharge Needs  Concerns to be addressed:  No discharge needs identified Readmission within the last 30 days:  No Current discharge risk:    Barriers to Discharge:  Insurance Authorization, Continued Medical Work up   Ingle, ReGina C, LCSW 06/01/2015, 10:58 AM  

## 2015-06-01 NOTE — Care Management Note (Signed)
Case Management Note  Patient Details  Name: Darin KoyanagiBill Conner MRN: [id number] Date of Birth: 05-Jul-1955  Subjective/Objective:                 S/p left total knee arthroplasty   Action/Plan: PT recommended SNF. Referral made to CSW, CSW working on SNF placement for short term rehab. Will continue to follow.  Expected Discharge Date:                  Expected Discharge Plan:  Skilled Nursing Facility  In-House Referral:  Clinical Social Work  Discharge planning Services  CM Consult  Post Acute Care Choice:  NA Choice offered to:     DME Arranged:  N/A DME Agency:     HH Arranged:  NA HH Agency:     Status of Service:  In process, will continue to follow  Medicare Important Message Given:    Date Medicare IM Given:    Medicare IM give by:    Date Additional Medicare IM Given:    Additional Medicare Important Message give by:     If discussed at Long Length of Stay Meetings, dates discussed:    Additional Comments:  Monica BectonKrieg, Dawson Albers Watson, RN 06/01/2015, 2:00 PM

## 2015-06-01 NOTE — Progress Notes (Signed)
Throughout the day I ask the pt if he was in pain and he stated no.  No pain medications were given today due to pt not complaining about any pain, even when being asked

## 2015-06-01 NOTE — NC FL2 (Signed)
[place] MEDICAID FL2 LEVEL OF CARE SCREENING TOOL     IDENTIFICATION  Patient Name: Darin Conner Birthdate: 04/07/1955 Sex: male Admission Date (Current Location): 05/31/2015  [facility] and [place] Number:  Producer, television/film/video and Address:  [facility], [address], [place], [place] [address]      Provider Number: [id number]  Attending Physician Name and Address:  Gean Birchwood, MD  Relative Name and Phone Number:       Current Level of Care: SNF Recommended Level of Care: Skilled Nursing Facility Prior Approval Number:    Date Approved/Denied:   PASRR Number: [id number]  Discharge Plan: SNF    Current Diagnoses: Patient Active Problem List   Diagnosis Date Noted  . Primary osteoarthritis of left knee 05/28/2015  . Primary osteoarthritis of right knee 01/13/2015  . Arthritis of knee 01/13/2015  . Fatigue due to sleep pattern disturbance 03/12/2014  . Nocturia more than twice per night 03/12/2014  . DM (diabetes mellitus) type 2, uncontrolled, with ketoacidosis (HCC) 03/12/2014  . OSA on CPAP 03/12/2013    Orientation RESPIRATION BLADDER Height & Weight     Self, Time, Situation, Place  Normal Continent Weight: 255 lb (115.667 kg) Height:  5' 11.5" (181.6 cm)  BEHAVIORAL SYMPTOMS/MOOD NEUROLOGICAL BOWEL NUTRITION STATUS      Continent Diet (diabetic diet)  AMBULATORY STATUS COMMUNICATION OF NEEDS Skin   Limited Assist Verbally Surgical wounds                       Personal Care Assistance Level of Assistance  Dressing, Bathing Bathing Assistance: Limited assistance   Dressing Assistance: Limited assistance     Functional Limitations Info             SPECIAL CARE FACTORS FREQUENCY  PT (By licensed PT), OT (By licensed OT)     PT Frequency: daily OT Frequency: daily            Contractures Contractures Info: Not present    Additional Factors Info  Allergies   Allergies Info: penicillins            Current Medications (06/01/2015):  This is the current hospital active medication list Current Facility-Administered Medications  Medication Dose Route Frequency Provider Last Rate Last Dose  . acetaminophen (TYLENOL) tablet 650 mg  650 mg Oral Q6H PRN Gean Birchwood, MD       Or  . acetaminophen (TYLENOL) suppository 650 mg  650 mg Rectal Q6H PRN Gean Birchwood, MD      . alum & mag hydroxide-simeth (MAALOX/MYLANTA) 200-200-20 MG/5ML suspension 30 mL  30 mL Oral Q4H PRN Gean Birchwood, MD      . amLODipine (NORVASC) tablet 5 mg  5 mg Oral Daily Darl Householder Masters, RPH   5 mg at 06/01/15 1100   And  . irbesartan (AVAPRO) tablet 300 mg  300 mg Oral Daily Darl Householder Masters, RPH   300 mg at 06/01/15 1100  . aspirin EC tablet 325 mg  325 mg Oral Q breakfast Gean Birchwood, MD      . bisacodyl (DULCOLAX) EC tablet 5 mg  5 mg Oral Daily PRN Gean Birchwood, MD      . dextrose 5 % and 0.45 % NaCl with KCl 20 mEq/L infusion   Intravenous Continuous Gean Birchwood, MD 125 mL/hr at 06/01/15 0033    . diphenhydrAMINE (BENADRYL) 12.5 MG/5ML elixir 12.5-25 mg  12.5-25 mg Oral Q4H PRN Gean Birchwood, MD      .  docusate sodium (COLACE) capsule 100 mg  100 mg Oral BID Gean BirchwoodFrank Rowan, MD   100 mg at 06/01/15 1001  . famotidine (PEPCID) tablet 10 mg  10 mg Oral BID Gean BirchwoodFrank Rowan, MD      . HYDROmorphone (DILAUDID) injection 0.5-1 mg  0.5-1 mg Intravenous Q2H PRN Gean BirchwoodFrank Rowan, MD   1 mg at 05/31/15 1657  . insulin aspart (novoLOG) injection 0-15 Units  0-15 Units Subcutaneous TID WC Gean BirchwoodFrank Rowan, MD   3 Units at 06/01/15 [phone]  . linagliptin (TRADJENTA) tablet 5 mg  5 mg Oral Daily Darl Householderlison M Masters, RPH   5 mg at 06/01/15 1001   And  . metFORMIN (GLUCOPHAGE) tablet 1,000 mg  1,000 mg Oral Q breakfast Darl HouseholderAlison M Masters, [facility]RPH      . menthol-cetylpyridinium (CEPACOL) lozenge 3 mg  1 lozenge Oral PRN Gean BirchwoodFrank Rowan, MD       Or  . phenol (CHLORASEPTIC) mouth spray 1 spray  1 spray Mouth/Throat PRN Gean BirchwoodFrank Rowan, MD      . methocarbamol (ROBAXIN) tablet  500 mg  500 mg Oral Q6H PRN Gean BirchwoodFrank Rowan, MD   500 mg at 06/01/15 0231   Or  . methocarbamol (ROBAXIN) 500 mg in dextrose 5 % 50 mL IVPB  500 mg Intravenous Q6H PRN Gean BirchwoodFrank Rowan, MD      . metoCLOPramide (REGLAN) tablet 5-10 mg  5-10 mg Oral Q8H PRN Gean BirchwoodFrank Rowan, MD       Or  . metoCLOPramide (REGLAN) injection 5-10 mg  5-10 mg Intravenous Q8H PRN Gean BirchwoodFrank Rowan, MD      . ondansetron [facility](ZOFRAN) tablet 4 mg  4 mg Oral Q6H PRN Gean BirchwoodFrank Rowan, MD       Or  . ondansetron [facility](ZOFRAN) injection 4 mg  4 mg Intravenous Q6H PRN Gean BirchwoodFrank Rowan, MD      . oxyCODONE (Oxy IR/ROXICODONE) immediate release tablet 5-10 mg  5-10 mg Oral Q3H PRN Gean BirchwoodFrank Rowan, MD   10 mg at 06/01/15 0630  . pravastatin (PRAVACHOL) tablet 20 mg  20 mg Oral Daily Gean BirchwoodFrank Rowan, MD   20 mg at 05/31/15 1657  . senna-docusate (Senokot-S) tablet 1 tablet  1 tablet Oral QHS PRN Gean BirchwoodFrank Rowan, MD      . sodium phosphate (FLEET) 7-19 GM/118ML enema 1 enema  1 enema Rectal Once PRN Gean BirchwoodFrank Rowan, MD      . triamcinolone (NASACORT) nasal inhaler 1 spray  1 spray Nasal Daily PRN Gean BirchwoodFrank Rowan, MD      . triamterene-hydrochlorothiazide (DYAZIDE) 37.5-25 MG per capsule 1 capsule  1 capsule Oral Daily Gean BirchwoodFrank Rowan, MD         Discharge Medications: Please see discharge summary for a list of discharge medications.  Relevant Imaging Results:  Relevant Lab Results:   Additional Information    Ernestine Mcmurrayngle, Heber CarolinaeGina C, LCSW

## 2015-06-01 NOTE — Progress Notes (Signed)
Patient has CPAP in room not ready to go on at this time but states he can place self on and off. Patient has home mask. RT made patient aware that if he needed any help to call.

## 2015-06-01 NOTE — Progress Notes (Signed)
Patient ID: Darin Conner, male   DOB: 1955-05-21, 60 y.o.   MRN: [id number] PATIENT ID: Darin Conner  MRN: [id number]  DOB/AGE:  1955-05-21 / 60 y.o.  1 Day Post-Op Procedure(s) (LRB): TOTAL LEFT KNEE ARTHROPLASTY (Left)    PROGRESS NOTE Subjective: Patient is alert, oriented, no Nausea, no Vomiting, yes passing gas. Taking PO well. Denies SOB, Chest or Calf Pain. Using Incentive Spirometer, PAS in Conner. Ambulate WBAT, CPM 0-60 Patient reports pain as 3/10 .    Objective: Vital signs in last 24 hours: Filed Vitals:   05/31/15 1936 05/31/15 2151 06/01/15 0035 06/01/15 0500  BP: 144/89  124/78 126/90  Pulse: 77 84 82 77  Temp: 98.9 F (37.2 C)  97.5 F (36.4 C) 97.2 F (36.2 C)  TempSrc: Oral  Oral Oral  Resp: 18 18 18 18   Height:      Weight:      SpO2: 97% 94% 97% 97%      Intake/Output from previous day: I/O last 3 completed shifts: In: 2828.8 [P.O.:175; I.V.:2543.8; IV Piggyback:110] Out: 2225 [Urine:1925; Blood:300]   Intake/Output this shift:     LABORATORY DATA:  Recent Labs  05/31/15 1621 05/31/15 2140 06/01/15 0328 06/01/15 0635  WBC  --   --  9.1  --   HGB  --   --  11.1*  --   HCT  --   --  32.9*  --   PLT  --   --  151  --   NA  --   --  136  --   K  --   --  3.7  --   CL  --   --  105  --   CO2  --   --  22  --   BUN  --   --  12  --   CREATININE  --   --  1.07  --   GLUCOSE  --   --  189*  --   GLUCAP 143* 189*  --  169*  CALCIUM  --   --  8.8*  --     Examination: Neurologically intact ABD soft Neurovascular intact Sensation intact distally Intact pulses distally Dorsiflexion/Plantar flexion intact Incision: dressing C/D/I No cellulitis present Compartment soft}  Assessment:   1 Day Post-Op Procedure(s) (LRB): TOTAL LEFT KNEE ARTHROPLASTY (Left) ADDITIONAL DIAGNOSIS: Expected Acute Blood Loss Anemia, Diabetes  Plan: PT/OT WBAT, CPM 5/hrs day until ROM 0-90 degrees, then D/C CPM DVT Prophylaxis:  SCDx72hrs, ASA 325 mg BID x 2  weeks DISCHARGE PLAN: Skilled Nursing Facility/Rehab, Darin Conner tomorrow DISCHARGE NEEDS: HHPT, CPM, Walker and 3-in-1 comode seat     Dessirae Scarola J 06/01/2015, 7:03 AM

## 2015-06-01 NOTE — Evaluation (Signed)
Physical Therapy Evaluation Patient Details Name: Darin Conner MRN: [id number] DOB: October 01, 1955 Today's Date: 06/01/2015   History of Present Illness  60 y/o male s/p L TKA on 05/31/15 with PMH of DM, HTN, HA, and dizziness.  Clinical Impression  Pt admitted with above diagnosis. Pt currently with functional limitations due to the deficits listed below (see PT Problem List). Pt will benefit from skilled PT to increase their independence and safety with mobility to allow discharge to the venue listed below.  Pt reports he went home after his last TKA and it did not go very well and he and MD had discussed SNF dc after this surgery.  Pt lives with wife and son, but they are gone at work and school and unable to provide much assistance.  Recommend SNF.     Follow Up Recommendations SNF    Equipment Recommendations  None recommended by PT    Recommendations for Other Services       Precautions / Restrictions Precautions Precautions: None Restrictions Weight Bearing Restrictions: Yes LLE Weight Bearing: Weight bearing as tolerated      Mobility  Bed Mobility Overal bed mobility: Needs Assistance Bed Mobility: Supine to Sit     Supine to sit: Min assist     General bed mobility comments: A for L LE and cues for technique  Transfers Overall transfer level: Needs assistance Equipment used: Rolling walker (2 wheeled) Transfers: Sit to/from Stand Sit to Stand: Min assist         General transfer comment: MIN A wtih cues for hand placement  Ambulation/Gait Ambulation/Gait assistance: Min guard Ambulation Distance (Feet): 25 Feet Assistive device: Rolling walker (2 wheeled) Gait Pattern/deviations: Step-to pattern;Antalgic;Decreased step length - right Gait velocity: decreased   General Gait Details: Cues for placement of RW and proper position within it  Stairs            Wheelchair Mobility    Modified Rankin (Stroke Patients Only)       Balance Overall  balance assessment: Needs assistance           Standing balance-Leahy Scale: Poor Standing balance comment: requires RW                              Pertinent Vitals/Pain Pain Assessment: 0-10 Pain Score: 6  Pain Location: L knee Pain Descriptors / Indicators: Aching Pain Intervention(s): Monitored during session;Repositioned;Ice applied    Home Living Family/patient expects to be discharged to:: Skilled nursing facility Living Arrangements: Spouse/significant other               Additional Comments: Pt works and pt needs to be at higher level before d/c home    Prior Function Level of Independence: Independent               Hand Dominance   Dominant Hand: Right    Extremity/Trunk Assessment   Upper Extremity Assessment: Defer to OT evaluation           Lower Extremity Assessment: LLE deficits/detail   LLE Deficits / Details: L knee AAROM ~ 60 degrees  Cervical / Trunk Assessment: Normal  Communication   Communication: No difficulties  Cognition Arousal/Alertness: Awake/alert Behavior During Therapy: WFL for tasks assessed/performed Overall Cognitive Status: Within Functional Limits for tasks assessed                      General Comments      Exercises  Total Joint Exercises Ankle Circles/Pumps: AROM;Both;Supine Quad Sets: AROM;Left;10 reps;Supine Heel Slides: AAROM;Left;10 reps Straight Leg Raises: AROM;5 reps;Left Goniometric ROM: ~- 4 - 60 degress with AAROM      Assessment/Plan    PT Assessment Patient needs continued PT services  PT Diagnosis Difficulty walking;Acute pain   PT Problem List Decreased strength;Decreased range of motion;Decreased activity tolerance;Decreased balance;Decreased mobility;Decreased knowledge of use of DME;Pain  PT Treatment Interventions DME instruction;Gait training;Functional mobility training;Therapeutic activities;Therapeutic exercise;Balance training   PT Goals (Current goals  can be found in the Care Plan section) Acute Rehab PT Goals Patient Stated Goal: [facility] PT Goal Formulation: With patient Time For Goal Achievement: 06/08/15 Potential to Achieve Goals: Good    Frequency 7X/week   Barriers to discharge Decreased caregiver support      Co-evaluation               End of Session                 Time: [id number] PT Time Calculation (min) (ACUTE ONLY): 35 min   Charges:   PT Evaluation $PT Eval Moderate Complexity: 1 Procedure PT Treatments $Gait Training: 8-22 mins   PT G Codes:        Deyjah Kindel LUBECK 06/01/2015, 12:10 PM

## 2015-06-02 LAB — CBC
HEMATOCRIT: 33.5 % — AB (ref 39.0–52.0)
HEMOGLOBIN: 11.3 g/dL — AB (ref 13.0–17.0)
MCH: 27 pg (ref 26.0–34.0)
MCHC: 33.7 g/dL (ref 30.0–36.0)
MCV: 80.1 fL (ref 78.0–100.0)
Platelets: 139 10*3/uL — ABNORMAL LOW (ref 150–400)
RBC: 4.18 MIL/uL — ABNORMAL LOW (ref 4.22–5.81)
RDW: 13.1 % (ref 11.5–15.5)
WBC: 9.3 10*3/uL (ref 4.0–10.5)

## 2015-06-02 LAB — GLUCOSE, CAPILLARY
GLUCOSE-CAPILLARY: 188 mg/dL — AB (ref 65–99)
Glucose-Capillary: 194 mg/dL — ABNORMAL HIGH (ref 65–99)

## 2015-06-02 MED ORDER — TRIAMTERENE-HCTZ 37.5-25 MG PO TABS
1.0000 | ORAL_TABLET | Freq: Every day | ORAL | Status: DC
  Administered 2015-06-02: 1 via ORAL
  Filled 2015-06-02: qty 1

## 2015-06-02 NOTE — Progress Notes (Addendum)
PATIENT ID: Darin KoyanagiBill Nielsen  MRN: [id number]  DOB/AGE:  58-Oct-1957 / 58 y.o.  2 Days Post-Op Procedure(s) (LRB): TOTAL LEFT KNEE ARTHROPLASTY (Left)    PROGRESS NOTE Subjective: Patient is alert, oriented, no Nausea, no Vomiting, yes passing gas. Taking PO well . Denies SOB, Chest or Calf Pain. Using Incentive Spirometer, PAS in place. Ambulate WBAT with pt walking 25 ft with therapy, CPM 0-60 Patient reports pain as 4/10 .    Objective: Vital signs in last 24 hours: Filed Vitals:   06/01/15 1100 06/01/15 1300 06/01/15 1950 06/02/15 0500  BP: 126/90 167/87 137/83 130/82  Pulse:  86 97 82  Temp:  98.7 F (37.1 C) 98.3 F (36.8 C) 97.8 F (36.6 C)  TempSrc:    Oral  Resp:  18 18 18   Height:      Weight:      SpO2:  97% 97% 96%      Intake/Output from previous day: I/O last 3 completed shifts: In: 3228.8 [P.O.:1375; I.V.:1743.8; IV Piggyback:110] Out: 3375 [Urine:3375]   Intake/Output this shift:     LABORATORY DATA:  Recent Labs  06/01/15 0328  06/01/15 1631 06/01/15 2240 06/02/15 0301 06/02/15 0702  WBC 9.1  --   --   --  9.3  --   HGB 11.1*  --   --   --  11.3*  --   HCT 32.9*  --   --   --  33.5*  --   PLT 151  --   --   --  139*  --   NA 136  --   --   --   --   --   K 3.7  --   --   --   --   --   CL 105  --   --   --   --   --   CO2 22  --   --   --   --   --   BUN 12  --   --   --   --   --   CREATININE 1.07  --   --   --   --   --   GLUCOSE 189*  --   --   --   --   --   GLUCAP  --   < > 163* 150*  --  194*  CALCIUM 8.8*  --   --   --   --   --   < > = values in this interval not displayed.  Examination: Neurologically intact Neurovascular intact Sensation intact distally Intact pulses distally Dorsiflexion/Plantar flexion intact Incision: dressing C/D/I No cellulitis present Compartment soft}  Assessment:   2 Days Post-Op Procedure(s) (LRB): TOTAL LEFT KNEE ARTHROPLASTY (Left) ADDITIONAL DIAGNOSIS: Expected Acute Blood Loss Anemia,  Diabetes  Plan: PT/OT WBAT, CPM 5/hrs day until ROM 0-90 degrees, then D/C CPM DVT Prophylaxis:  SCDx72hrs, ASA 325 mg BID x 2 weeks DISCHARGE PLAN: Skilled Nursing Facility/Rehab when bed available - just received a call from the floor and the patient states that  He is changed his mind and would like to go home.  Physical therapy signed off as the patient walked 200 feet. DISCHARGE NEEDS: HHPT, CPM, Walker and 3-in-1 comode seat     Chattie Greeson R 06/02/2015, 7:25 AM

## 2015-06-02 NOTE — Care Management Note (Signed)
Case Management Note  Patient Details  Name: Darin KoyanagiBill Conner MRN: [id number] Date of Birth: 1955/03/02  Subjective/Objective:                S/p left total knee arthroplasty    Action/Plan: Based on patient's progress, PT now recommending HHPT. Spoke with patient, he is agreeable with going home with HHPT instead of SNF. Patient stated that his wife and other family members would be able to assist him after discharge. He selected [facility] which he worked with before. Discharge order changed to home by PA. [facility]ed [facility] at [facility] and [facility] up HHPT. Patient has a rolling walker and 3N1 from previous surgery. Informed Darin Conner with [employer] that patient will be discharged to home, he will deliver CPM to home.    Expected Discharge Date:                  Expected Discharge Plan:  Home w Home Health Services  In-House Referral:  Clinical Social Work  Discharge planning Services  CM Consult  Post Acute Care Choice:  NA, Durable Medical Equipment, Home Health Choice offered to:  Patient  DME Arranged:  CPM DME Agency:  [employer]  HH Arranged:  PT HH Agency:  [facility], [facility]  Status of Service:  Completed, signed off  Medicare Important Message Given:    Date Medicare IM Given:    Medicare IM give by:    Date Additional Medicare IM Given:    Additional Medicare Important Message give by:     If discussed at Long Length of Stay Meetings, dates discussed:    Additional Comments:  Darin Conner, Darin Bellanca Watson, RN 06/02/2015, 10:36 AM

## 2015-06-02 NOTE — Discharge Summary (Signed)
Patient ID: Marquee Fuchs MRN: [id number] DOB/AGE: 58-Dec-1957 58 y.o.  Admit date: 05/31/2015 Discharge date: 06/02/2015  Admission Diagnoses:  Principal Problem:   Primary osteoarthritis of left knee   Discharge Diagnoses:  Same  Past Medical History  Diagnosis Date  . Hyperlipidemia   . Diabetes (HCC)   . Erectile dysfunction   . Osteoarthritis   . Disturbance of skin sensation   . OSA on CPAP   . Hypertension   . Headache   . Dizziness     Surgeries: Procedure(s): TOTAL LEFT KNEE ARTHROPLASTY on 05/31/2015   Consultants:    Discharged Condition: Improved  Hospital Course: Mannix Kroeker is an 60 y.o. male who was admitted 05/31/2015 for operative treatment ofPrimary osteoarthritis of left knee. Patient has severe unremitting pain that affects sleep, daily activities, and work/hobbies. After pre-op clearance the patient was taken to the operating room on 05/31/2015 and underwent  Procedure(s): TOTAL LEFT KNEE ARTHROPLASTY.    Patient was given perioperative antibiotics: Anti-infectives    Start     Dose/Rate Route Frequency Ordered Stop   05/31/15 1030  vancomycin (VANCOCIN) 1,500 mg in sodium chloride 0.9 % 500 mL IVPB     1,500 mg 250 mL/hr over 120 Minutes Intravenous To ShortStay Surgical 05/30/15 1444 05/31/15 1205       Patient was given sequential compression devices, early ambulation, and chemoprophylaxis to prevent DVT.  Patient benefited maximally from hospital stay and there were no complications.    Recent vital signs: Patient Vitals for the past 24 hrs:  BP Temp Temp src Pulse Resp SpO2  06/02/15 0500 130/82 mmHg 97.8 F (36.6 C) Oral 82 18 96 %  06/01/15 1950 137/83 mmHg 98.3 F (36.8 C) - 97 18 97 %  06/01/15 1300 (!) 167/87 mmHg 98.7 F (37.1 C) - 86 18 97 %  06/01/15 1100 126/90 mmHg - - - - -     Recent laboratory studies:  Recent Labs  06/01/15 0328 06/02/15 0301  WBC 9.1 9.3  HGB 11.1* 11.3*  HCT 32.9* 33.5*  PLT 151 139*  NA 136  --   K  3.7  --   CL 105  --   CO2 22  --   BUN 12  --   CREATININE 1.07  --   GLUCOSE 189*  --   CALCIUM 8.8*  --      Discharge Medications:     Medication List    STOP taking these medications        aspirin 81 MG chewable tablet  Replaced by:  aspirin EC 325 MG tablet     HYDROcodone-acetaminophen 5-325 MG tablet  Commonly known as:  NORCO/VICODIN     ibuprofen 200 MG tablet  Commonly known as:  ADVIL,MOTRIN     meloxicam 15 MG tablet  Commonly known as:  MOBIC      TAKE these medications        amLODipine-olmesartan 5-40 MG tablet  Commonly known as:  AZOR  Take 1 tablet by mouth daily.     aspirin EC 325 MG tablet  Take 1 tablet (325 mg total) by mouth 2 (two) times daily.     glucose blood test strip  1 each by Other route 2 (two) times daily. Use as instructed     JANUMET 50-1000 MG tablet  Generic drug:  sitaGLIPtin-metformin  Take 1 tablet by mouth daily.     methocarbamol 500 MG tablet  Commonly known as:  ROBAXIN  Take 1 tablet (500  mg total) by mouth 2 (two) times daily with a meal.     NASACORT ALLERGY 24HR NA  Place 1 spray into the nose daily as needed (Allergies).     OMEGA 3 PO  Take 1 capsule by mouth daily.     oxyCODONE-acetaminophen 5-325 MG tablet  Commonly known as:  ROXICET  Take 1 tablet by mouth every 4 (four) hours as needed.     pravastatin 20 MG tablet  Commonly known as:  PRAVACHOL  Take 20 mg by mouth daily.     tadalafil 20 MG tablet  Commonly known as:  CIALIS  Take 20 mg by mouth daily as needed for erectile dysfunction.     triamterene-hydrochlorothiazide 37.5-25 MG capsule  Commonly known as:  DYAZIDE  Take 1 capsule by mouth daily.     vitamin C 250 MG tablet  Commonly known as:  ASCORBIC ACID  Take 250 mg by mouth daily.     ZANTAC 75 75 MG tablet  Generic drug:  ranitidine  Take 75 mg by mouth 2 (two) times daily as needed for heartburn (Chewable).        Diagnostic Studies: No results  found.  Disposition: 01-Home or Self Care      Discharge Instructions    CPM    Complete by:  As directed   Continuous passive motion machine (CPM):      Use the CPM from 0 to 60  for 5 hours per day.      You may increase by 10 degrees per day.  You may break it up into 2 or 3 sessions per day.      Use CPM for 2 weeks or until you are told to stop.     Call MD / Call 911    Complete by:  As directed   If you experience chest pain or shortness of breath, CALL 911 and be transported to the hospital emergency room.  If you develope a fever above 101 F, pus (white drainage) or increased drainage or redness at the wound, or calf pain, call your surgeon's office.     Constipation Prevention    Complete by:  As directed   Drink plenty of fluids.  Prune juice may be helpful.  You may use a stool softener, such as Colace (over the counter) 100 mg twice a day.  Use MiraLax (over the counter) for constipation as needed.     Diet - low sodium heart healthy    Complete by:  As directed      Driving restrictions    Complete by:  As directed   No driving for 2 weeks     Increase activity slowly as tolerated    Complete by:  As directed      Patient may shower    Complete by:  As directed   You may shower without a dressing once there is no drainage.  Do not wash over the wound.  If drainage remains, cover wound with plastic wrap and then shower.           Follow-up Information    Follow up with Nestor LewandowskyOWAN,FRANK J, MD In 2 weeks.   Specialty:  Orthopedic Surgery   Contact information:   [address] [place] [address] [phone]       [facility] .   Specialty:  Skilled Nursing Company secretaryacility   Contact information:   [address] [place] [id number] [phone]  Signed: Daiden Coltrane R 06/02/2015, 10:32 AM

## 2015-06-02 NOTE — Discharge Summary (Deleted)
Patient ID: Darin Conner MRN: [id number] DOB/AGE: 03/14/1955 60 y.o.  Admit date: 05/31/2015 Discharge date: 06/02/2015  Admission Diagnoses:  Principal Problem:   Primary osteoarthritis of left knee   Discharge Diagnoses:  Same  Past Medical History  Diagnosis Date  . Hyperlipidemia   . Diabetes (HCC)   . Erectile dysfunction   . Osteoarthritis   . Disturbance of skin sensation   . OSA on CPAP   . Hypertension   . Headache   . Dizziness     Surgeries: Procedure(s): TOTAL LEFT KNEE ARTHROPLASTY on 05/31/2015   Consultants:    Discharged Condition: Improved  Hospital Course: Darin Conner is an 60 y.o. male who was admitted 05/31/2015 for operative treatment ofPrimary osteoarthritis of left knee. Patient has severe unremitting pain that affects sleep, daily activities, and work/hobbies. After pre-op clearance the patient was taken to the operating room on 05/31/2015 and underwent  Procedure(s): TOTAL LEFT KNEE ARTHROPLASTY.    Patient was given perioperative antibiotics: Anti-infectives    Start     Dose/Rate Route Frequency Ordered Stop   05/31/15 1030  vancomycin (VANCOCIN) 1,500 mg in sodium chloride 0.9 % 500 mL IVPB     1,500 mg 250 mL/hr over 120 Minutes Intravenous To ShortStay Surgical 05/30/15 1444 05/31/15 1205       Patient was given sequential compression devices, early ambulation, and chemoprophylaxis to prevent DVT.  Patient benefited maximally from hospital stay and there were no complications.    Recent vital signs: Patient Vitals for the past 24 hrs:  BP Temp Temp src Pulse Resp SpO2  06/02/15 0500 130/82 mmHg 97.8 F (36.6 C) Oral 82 18 96 %  06/01/15 1950 137/83 mmHg 98.3 F (36.8 C) - 97 18 97 %  06/01/15 1300 (!) 167/87 mmHg 98.7 F (37.1 C) - 86 18 97 %  06/01/15 1100 126/90 mmHg - - - - -     Recent laboratory studies:  Recent Labs  06/01/15 0328 06/02/15 0301  WBC 9.1 9.3  HGB 11.1* 11.3*  HCT 32.9* 33.5*  PLT 151 139*  NA 136  --   K  3.7  --   CL 105  --   CO2 22  --   BUN 12  --   CREATININE 1.07  --   GLUCOSE 189*  --   CALCIUM 8.8*  --      Discharge Medications:     Medication List    STOP taking these medications        aspirin 81 MG chewable tablet  Replaced by:  aspirin EC 325 MG tablet     HYDROcodone-acetaminophen 5-325 MG tablet  Commonly known as:  NORCO/VICODIN     ibuprofen 200 MG tablet  Commonly known as:  ADVIL,MOTRIN     meloxicam 15 MG tablet  Commonly known as:  MOBIC      TAKE these medications        amLODipine-olmesartan 5-40 MG tablet  Commonly known as:  AZOR  Take 1 tablet by mouth daily.     aspirin EC 325 MG tablet  Take 1 tablet (325 mg total) by mouth 2 (two) times daily.     glucose blood test strip  1 each by Other route 2 (two) times daily. Use as instructed     JANUMET 50-1000 MG tablet  Generic drug:  sitaGLIPtin-metformin  Take 1 tablet by mouth daily.     methocarbamol 500 MG tablet  Commonly known as:  ROBAXIN  Take 1 tablet (500  mg total) by mouth 2 (two) times daily with a meal.     NASACORT ALLERGY 24HR NA  Place 1 spray into the nose daily as needed (Allergies).     OMEGA 3 PO  Take 1 capsule by mouth daily.     oxyCODONE-acetaminophen 5-325 MG tablet  Commonly known as:  ROXICET  Take 1 tablet by mouth every 4 (four) hours as needed.     pravastatin 20 MG tablet  Commonly known as:  PRAVACHOL  Take 20 mg by mouth daily.     tadalafil 20 MG tablet  Commonly known as:  CIALIS  Take 20 mg by mouth daily as needed for erectile dysfunction.     triamterene-hydrochlorothiazide 37.5-25 MG capsule  Commonly known as:  DYAZIDE  Take 1 capsule by mouth daily.     vitamin C 250 MG tablet  Commonly known as:  ASCORBIC ACID  Take 250 mg by mouth daily.     ZANTAC 75 75 MG tablet  Generic drug:  ranitidine  Take 75 mg by mouth 2 (two) times daily as needed for heartburn (Chewable).        Diagnostic Studies: No results  found.  Disposition: 01-Home or Self Care      Discharge Instructions    CPM    Complete by:  As directed   Continuous passive motion machine (CPM):      Use the CPM from 0 to 60  for 5 hours per day.      You may increase by 10 degrees per day.  You may break it up into 2 or 3 sessions per day.      Use CPM for 2 weeks or until you are told to stop.     Call MD / Call 911    Complete by:  As directed   If you experience chest pain or shortness of breath, CALL 911 and be transported to the hospital emergency room.  If you develope a fever above 101 F, pus (white drainage) or increased drainage or redness at the wound, or calf pain, call your surgeon's office.     Constipation Prevention    Complete by:  As directed   Drink plenty of fluids.  Prune juice may be helpful.  You may use a stool softener, such as Colace (over the counter) 100 mg twice a day.  Use MiraLax (over the counter) for constipation as needed.     Diet - low sodium heart healthy    Complete by:  As directed      Driving restrictions    Complete by:  As directed   No driving for 2 weeks     Increase activity slowly as tolerated    Complete by:  As directed      Patient may shower    Complete by:  As directed   You may shower without a dressing once there is no drainage.  Do not wash over the wound.  If drainage remains, cover wound with plastic wrap and then shower.           Follow-up Information    Follow up with Nestor LewandowskyOWAN,FRANK J, MD In 2 weeks.   Specialty:  Orthopedic Surgery   Contact information:   [address] [place] [address] [phone]        Signed: Vear ClockHILLIPS, Curt Oatis R 06/02/2015, 7:28 AM

## 2015-06-02 NOTE — Progress Notes (Signed)
Physical Therapy Treatment Patient Details Name: Darin KoyanagiBill Conner MRN: [id number] DOB: 04-Aug-1955 Today's Date: 06/02/2015    History of Present Illness 60 y/o male s/p L TKA on 05/31/15 with PMH of DM, HTN, HA, and dizziness.    PT Comments    Pt moving much better today and ambulated 200' with RW. He feels he is doing better than he was at this point after he had other knee done in December.  Discussed SNF vs. HHPT and pt feels he can go home with family support as his daughter is coming until next Monday.  Pt also getting up in room by himself.  Practiced stair training and managed 1 step with min/guard with cues for technique.  PT discussed with social work.   Follow Up Recommendations  Home health PT;Supervision - Intermittent     Equipment Recommendations  None recommended by PT    Recommendations for Other Services       Precautions / Restrictions Precautions Precautions: Knee Restrictions Weight Bearing Restrictions: Yes LLE Weight Bearing: Weight bearing as tolerated    Mobility  Bed Mobility Overal bed mobility: Needs Assistance Bed Mobility: Supine to Sit     Supine to sit: Supervision     General bed mobility comments: Pt able to get to EOB when used gait belt as leg lifter for L LE  Transfers Overall transfer level: Needs assistance Equipment used: Rolling walker (2 wheeled) Transfers: Sit to/from Stand Sit to Stand: Modified independent (Device/Increase time);Supervision         General transfer comment: MOD I for standing, S for sitting for hand placement  Ambulation/Gait Ambulation/Gait assistance: Supervision;Min guard Ambulation Distance (Feet): 200 Feet Assistive device: Rolling walker (2 wheeled) Gait Pattern/deviations: Decreased step length - right;Decreased step length - left;Step-through pattern;Antalgic Gait velocity: decreased Gait velocity interpretation: Below normal speed for age/gender General Gait Details: Pt with good heel to toe  pattern with cues for posture and head up   Stairs Stairs: Yes Stairs assistance: Min guard Stair Management: Forwards;Backwards;With walker Number of Stairs: 1 General stair comments: Cues for technique  Wheelchair Mobility    Modified Rankin (Stroke Patients Only)       Balance Overall balance assessment: Needs assistance   Sitting balance-Leahy Scale: Good     Standing balance support: During functional activity Standing balance-Leahy Scale: Fair                      Cognition Arousal/Alertness: Awake/alert Behavior During Therapy: WFL for tasks assessed/performed Overall Cognitive Status: Within Functional Limits for tasks assessed                      Exercises Total Joint Exercises Ankle Circles/Pumps: AROM;Both;Supine Quad Sets: AROM;Left;10 reps;Supine Heel Slides: AAROM;Left;10 reps Hip ABduction/ADduction: AROM;Left;Supine;15 reps Straight Leg Raises: AROM;5 reps;Left Goniometric ROM: -7 to 65    General Comments        Pertinent Vitals/Pain Pain Assessment: 0-10 Pain Score: 6  Pain Location: L knee Pain Descriptors / Indicators: Aching;Tightness;Operative site guarding Pain Intervention(s): Limited activity within patient's tolerance;Monitored during session;Repositioned    Home Living                      Prior Function            PT Goals (current goals can now be found in the care plan section) Acute Rehab PT Goals PT Goal Formulation: With patient Time For Goal Achievement: 06/08/15 Potential to Achieve Goals:  Good Progress towards PT goals: Progressing toward goals    Frequency  7X/week    PT Plan Discharge plan needs to be updated    Co-evaluation             End of Session Equipment Utilized During Treatment: Gait belt Activity Tolerance: Patient tolerated treatment well Patient left: in chair;with call bell/phone within reach     Time: 0828-0910 PT Time Calculation (min) (ACUTE ONLY): 42  min  Charges:  $Gait Training: 23-37 mins $Therapeutic Exercise: 8-22 mins                    G Codes:      Darin Conner 06/02/2015, 9:45 AM

## 2015-06-02 NOTE — Progress Notes (Signed)
Pt's disposition changed to d/c home after working with PT this morning and walking 200 feet. Yahoo! IncEric Philips PA was notified and agreeable to change, discharge still for today. Pt was cleared by PT/OT, has needed equipment at home. Discharge instructions and prescriptions reviewed with pt, all questions answered. Belongings gathered and sent with pt. Will be assisted to car by volunteer when his ride arrives.   Lowella DellHudson, Sonu Kruckenberg G 06/02/2015

## 2015-06-02 NOTE — Clinical Social Work Note (Signed)
CSW received referral for SNF.  Case discussed with case manager, and plan is to discharge home.  CSW to sign off please re-consult if social work needs arise.  Akshitha Culmer R. Tipton Ballow, MSW, LCSWA [phone]  

## 2015-06-10 ENCOUNTER — Ambulatory Visit (INDEPENDENT_AMBULATORY_CARE_PROVIDER_SITE_OTHER): Payer: BLUE CROSS/BLUE SHIELD | Admitting: Nurse Practitioner

## 2015-06-10 ENCOUNTER — Encounter: Payer: Self-pay | Admitting: Nurse Practitioner

## 2015-06-10 VITALS — BP 130/85 | HR 82 | Resp 18 | Ht 71.5 in | Wt 254.2 lb

## 2015-06-10 DIAGNOSIS — G4733 Obstructive sleep apnea (adult) (pediatric): Secondary | ICD-10-CM | POA: Diagnosis not present

## 2015-06-10 DIAGNOSIS — Z9989 Dependence on other enabling machines and devices: Principal | ICD-10-CM

## 2015-06-10 NOTE — Progress Notes (Signed)
[facility]  PATIENT: Darin Darin Conner DOB: Jul 03, 1955   REASON FOR VISIT: Follow-up for sleep apnea with CPAP HISTORY FROM: Patient    HISTORY OF PRESENT ILLNESS:UPDATE 05/11/2017CM Darin Darin Conner, 60 year old male returns for yearly follow-up for his obstructive sleep apnea. His compliance report from 03/12/2015 to 06/09/2015 shows 82% compliance for 74 days and  5 days with less then 4 hours. He had recent knee replacement was in the hospital for 4 days. Average usage 6 hours 24 minutes pressure 5 cm. AHI 1.3 at auto titration no leaks. ESS score today is 22 however he is on pain medication. Fatigue severity scale is 33. He has not started his rehabilitation for his left knee replacement as yet.  He is ambulating with a quad cane, he returns for reevaluation   UPDATE 5/12/16CMMr. Darin Conner, 60 year old male returns for follow-up for his obstructive sleep apnea. He was last seen in this office by Dr. Vickey Huger 03/12/2014. At that time he was asked to get a new CPAP machine which he has done. His CPAP download shows 100% compliance at 5 hours and 47 minutes average with a average AHI of 1.0 at auto titration. He claims he is less fatigued however he does state; his medications cause fatigue as well. ESS score is 11. He returns for reevaluation    HISTORY:(CD)Darin Darin Conner, a 60 year old Caucasian right-handed gentleman, presents here as a new patient to the sleep clinic. PMHx: Dr. Merri Brunette office stated that the patient has a known history of diabetes which has become less controlled over the last 12 months. He was treated for essential hypertension, mainly with diuretics and calcium channel blocker, and he has gastroesophageal reflux disease.He used to be a patient of Dr. Elvera Lennox. Laboratory results from [facility] physic and an increase in HB A1c now at 5.9, his cholesterol panel on treatment had improved.  There were normal liver and kidney function snorted. The patient  requested a referral to see a doctor for sleep apnea followup. The sleep studies documented the following: The patient had a sleep latency of 32 minutes and a sleep efficiency of only 71.2%.  He slept for more than 40% of the night and supine sleep position- but the REM sleep was only noted when he slept on his left side. An AHI index of 81.4 was stated for the left lateral position and a 54.24 the supine position. The RDIs were similar. AHI for total sleep time was 48.4 /hr. . CPAP titration was initiated and the patient titrated to 11 cm water ,with an AHI of 0.0 . There was 100% supine sleep and it was noted that his hypoxemia resolved on the CPAP. He developed periodic limb movements with CPAP therapy.  He was fitted with a full face mask which began pinching his nose and he still has visible.pressure marks. He felt uncomfortable "had to Negotiate" with the [employer] to get a new mask. 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  The patient is seen here on 03-12-14 after continued to use his old established CPAP machine with new settings. A download confirmed a 2.2 residual AHI at a CPAP pressure of 12 cm water. This was on 2-10 2015. He feels a little better since he got new equipment mask tubing etc. that he still feels not up to 100%. Also his average residual AHI was very low I wonder if the patient would do better with an auto titration device. His machine is also partially broken, I think in  that case he needs a new machine. He has been followed by pediatric specialists as his DME. A download from 03-11-14 obtained in the office shows 100% compliance at 6 hours and 30 minutes average with a average AHI of 2.6 at 11 cm water. Darin Darin Conner endorsed his fatigue severity scale at 33 points today and the Epworth Sleepiness Scale at 6 points his fatigue score is actually higher than last time his sleepiness score is lower.  REVIEW OF SYSTEMS: Full 14 system review of systems  performed and notable only for those listed, all others are neg:  Constitutional: neg  Cardiovascular: neg Ear/Nose/Throat: neg  Skin: neg Eyes: Ringing in the ears Respiratory: neg Gastroitestinal: neg  Hematology/Lymphatic: neg  Endocrine: Excessive thirst Musculoskeletal: Joint pain Allergy/Immunology: neg Neurological: neg Psychiatric: neg Sleep : Obstructive sleep apnea with CPAP   ALLERGIES: Allergies  Allergen Reactions  . Penicillins Other (See Comments)    Unspecified  Has patient had a PCN reaction causing immediate rash, facial/tongue/throat swelling, SOB or lightheadedness with hypotension: YES Has patient had a PCN reaction causing severe rash involving mucus membranes or skin necrosis: NO Has patient had a PCN reaction that required hospitalization NO Has patient had a PCN reaction occurring within the last 10 years: NO If all of the above answers are "NO", then may proceed with Cephalosporin use.      HOME MEDICATIONS: Outpatient Prescriptions Prior to Visit  Medication Sig Dispense Refill  . amLODipine-olmesartan (AZOR) 5-40 MG tablet Take 1 tablet by mouth daily.    Marland Kitchen. aspirin EC 325 MG tablet Take 1 tablet (325 mg total) by mouth 2 (two) times daily. 30 tablet 0  . glucose blood test strip 1 each by Other route 2 (two) times daily. Use as instructed    . JANUMET 50-1000 MG per tablet Take 1 tablet by mouth daily.   4  . methocarbamol (ROBAXIN) 500 MG tablet Take 1 tablet (500 mg total) by mouth 2 (two) times daily with a meal. 60 tablet 0  . Omega-3 Fatty Acids (OMEGA 3 PO) Take 1 capsule by mouth daily.    Marland Kitchen. oxyCODONE-acetaminophen (ROXICET) 5-325 MG tablet Take 1 tablet by mouth every 4 (four) hours as needed. 60 tablet 0  . pravastatin (PRAVACHOL) 20 MG tablet Take 20 mg by mouth daily.    . ranitidine (ZANTAC 75) 75 MG tablet Take 75 mg by mouth 2 (two) times daily as needed for heartburn (Chewable).    . Triamcinolone Acetonide (NASACORT ALLERGY 24HR  NA) Place 1 spray into the nose daily as needed (Allergies).    . triamterene-hydrochlorothiazide (DYAZIDE) 37.5-25 MG per capsule Take 1 capsule by mouth daily.    . vitamin C (ASCORBIC ACID) 250 MG tablet Take 250 mg by mouth daily.    . tadalafil (CIALIS) 20 MG tablet Take 20 mg by mouth daily as needed for erectile dysfunction.     No facility-administered medications prior to visit.    PAST MEDICAL HISTORY: Past Medical History  Diagnosis Date  . Hyperlipidemia   . Diabetes (HCC)   . Erectile dysfunction   . Osteoarthritis   . Disturbance of skin sensation   . OSA on CPAP   . Hypertension   . Headache   . Dizziness     PAST SURGICAL HISTORY: Past Surgical History  Procedure Laterality Date  . Total knee arthroplasty Right 01/13/2015  . Total knee arthroplasty Right 01/13/2015    Procedure: TOTAL KNEE ARTHROPLASTY;  Surgeon: Gean BirchwoodFrank Rowan, MD;  Location:  [facility] OR;  Service: Orthopedics;  Laterality: Right;  . Total knee arthroplasty Left 05/31/2015    Procedure: TOTAL LEFT KNEE ARTHROPLASTY;  Surgeon: Gean Birchwood, MD;  Location: [facility] OR;  Service: Orthopedics;  Laterality: Left;    FAMILY HISTORY: Family History  Problem Relation Age of Onset  . Diabetes Father   . Prostate cancer Father   . Cancer Paternal Aunt   . Diabetes Sister     SOCIAL HISTORY: Social History   Social History  . Marital Status: Married    Spouse Name: Joni Reining  . Number of Children: 5  . Years of Education: GED   Occupational History  . Carpenter    Social History Main Topics  . Smoking status: Former Smoker -- 20 years  . Smokeless tobacco: Never Used     Comment: 2003  . Alcohol Use: 0.0 oz/week     Comment: twice a week  . Drug Use: No  . Sexual Activity: Not on file   Other Topics Concern  . Not on file   Social History Narrative   Patient is married Joni Reining).   Patient has five children.   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   Patient has a Ged.    Patient drinks 3-4 cups of coffee daily.   Patient is right-handed.              PHYSICAL EXAM  Filed Vitals:   06/10/15 0901  BP: 130/85  Pulse: 82  Resp: 18  Height: 5' 11.5" (1.816 m)  Weight: 254 lb 3.7 oz (115.318 kg)   Body mass index is 34.97 kg/(m^2). General: The patient is awake, alert and appears not in acute distress. The patient is well groomed. Head: Normocephalic, atraumatic.  Neck is supple. Mallampati 3 , neck circumference: 16  Cardiovascular: Regular rate and rhythm ,  Respiratory: Lungs are clear to auscultation. Skin: Without evidence of edema, or rash   Neurologic exam : The patient is awake and alert, oriented to place and time. Memory subjective described as intact. There is a normal attention span & concentration ability. Speech is fluent without dysarthria, dysphonia or aphasia.Mood and affect are appropriate.ESS 22 due to pain meds for recent knee surgery.  Cranial nerves:Pupils are equal and briskly reactive to light. Funduscopic exam without evidence of pallor or edema. Extraocular movements in vertical and horizontal planes intact and without nystagmus. Visual fields by finger perimetry are intact.Hearing to finger rub intact. Facial sensation intact to fine touch. Facial motor strength is symmetric and tongue and uvula move midline. Motor: normal bulk and tone, full strength in the BUE, BLE, fine finger movements normal, no pronator drift.  Coordination: finger-nose-finger, heel-to-shin bilaterally, no dysmetria Reflexes: Brachioradialis 2/2, biceps 2/2, triceps 2/2, patellar 2/2, Achilles 2/2, plantar responses were flexor bilaterally. Gait and Station: Rising up from seated position without assistance, normal stance, moderate stride, ambulates with a quad cane due to recent left knee surgery.  DIAGNOSTIC DATA (LABS, IMAGING, TESTING) - I reviewed patient records, labs, notes, testing and imaging myself where available.  Lab Results    Component Value Date   WBC 9.3 06/02/2015   HGB 11.3* 06/02/2015   HCT 33.5* 06/02/2015   MCV 80.1 06/02/2015   PLT 139* 06/02/2015      Component Value Date/Time   NA 136 06/01/2015 0328   K 3.7 06/01/2015 0328   CL 105 06/01/2015 0328   CO2 22 06/01/2015 0328   GLUCOSE 189* 06/01/2015 0328   BUN 12  06/01/2015 0328   CREATININE 1.07 06/01/2015 0328   CALCIUM 8.8* 06/01/2015 0328   GFRNONAA >60 06/01/2015 0328   GFRAA >60 06/01/2015 0328    Lab Results  Component Value Date   HGBA1C 6.3* 05/31/2015    ASSESSMENT AND PLAN 60 y.o. year old male has a past medical history of Hyperlipidemia; Diabetes; Osteoarthritis; Disturbance of skin sensation; OSA on CPAP; Hypertension; here to follow-up. His CPAP download from 03/12/2015 to 06/09/2015 shows 82% compliance for 74 days and  5 days with less then 4 hours. He had recent knee replacement was in the hospital for 4 days. Average usage 6 hours 24 minutes pressure 5 cm. AHI 1.3 at auto titration no leaks.   Continue CPAP settings same Follow-up yearly and when necessary next with Dr. Vickey Huger Nilda Riggs, [facility], [facility], APRN  [facility] [address], [address] [place], [place] [address] [phone]

## 2015-06-10 NOTE — Patient Instructions (Signed)
Good compliance with CPAP continue same settings F/U yearly  Next with Dr. Vickey Hugerohmeier

## 2015-12-15 ENCOUNTER — Ambulatory Visit (INDEPENDENT_AMBULATORY_CARE_PROVIDER_SITE_OTHER): Payer: BLUE CROSS/BLUE SHIELD | Admitting: Specialist

## 2015-12-15 ENCOUNTER — Encounter (INDEPENDENT_AMBULATORY_CARE_PROVIDER_SITE_OTHER): Payer: Self-pay | Admitting: Specialist

## 2015-12-15 ENCOUNTER — Ambulatory Visit (INDEPENDENT_AMBULATORY_CARE_PROVIDER_SITE_OTHER): Payer: Self-pay

## 2015-12-15 VITALS — BP 153/98 | HR 76 | Ht 71.0 in | Wt 255.0 lb

## 2015-12-15 DIAGNOSIS — M5136 Other intervertebral disc degeneration, lumbar region: Secondary | ICD-10-CM | POA: Diagnosis not present

## 2015-12-15 DIAGNOSIS — M48062 Spinal stenosis, lumbar region with neurogenic claudication: Secondary | ICD-10-CM

## 2015-12-15 DIAGNOSIS — G8929 Other chronic pain: Secondary | ICD-10-CM

## 2015-12-15 DIAGNOSIS — M5441 Lumbago with sciatica, right side: Secondary | ICD-10-CM

## 2015-12-15 MED ORDER — GABAPENTIN 100 MG PO CAPS: 100.0000 mg | ORAL_CAPSULE | Freq: Every day | ORAL | 6 refills | Status: AC

## 2015-12-15 NOTE — Progress Notes (Signed)
Office Visit Note   Patient: Darin Conner           Date of Birth: 12-18-1955           MRN: [id number] Visit Date: 12/15/2015              Requested by: Deatra JamesVyvyan Sun, MD [phone] [place], [place] [id number] PCP: Leanor RubensteinSUN,VYVYAN Y, MD   Assessment & Plan: Visit Diagnoses:  1. Chronic bilateral low back pain with right-sided sciatica   2. Degenerative disc disease, lumbar   3. Spinal stenosis of lumbar region with neurogenic claudication     Plan:Avoid bending, stooping and avoid lifting weights greater than 10 lbs. Avoid prolong standing and walking. Avoid frequent bending and stooping  No lifting greater than 10 lbs. May use ice or moist heat for pain. Weight loss is of benefit. Handicap license is approved. Diclofenac twice a day for inflamation. Start gabapentin 100 mg po at night for neurogenic pain. Use a stationary bike or  Pool walking program.   Follow-Up Instructions: No Follow-up on file.   Orders:  Orders Placed This Encounter  Procedures  . XR Lumbar Spine 2-3 Views   No orders of the defined types were placed in this encounter.     Procedures: No procedures performed   Clinical Data: No additional findings.   Subjective: Chief Complaint  Patient presents with  . Lower Back - Pain    60 year old male comes in today with complaints of low back pain. States low back pain for years, becoming worse. Pain radiates into right leg, numbness and tingling every now and then. Pain worse with trying to get up from sitting position. Pain worsening with lifting, has avoiding lifting since knee surgery last year. 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 Up and down ladders. Surgery by Dr. Turner Danielsowan Right in December and the left in May. Reports that Dr. Turner Danielsowan was the Preferred surgeon [facility].Knees with a lot of swelling and pain. Time is what the surgeons  think will improve the knee conditions.    Review of Systems  HENT: Positive for congestion, sinus pressure and sneezing.   Eyes: Negative.   Respiratory: Negative.   Cardiovascular: Negative.   Gastrointestinal: Negative.   Endocrine: Negative.   Genitourinary: Negative.   Musculoskeletal: Positive for back pain.  Skin: Negative.   Neurological: Positive for weakness and numbness.  Hematological: Negative.   Psychiatric/Behavioral: Negative.      Objective: Vital Signs: BP (!) 153/98   Pulse 76   Ht 5\' 11"  (1.803 m)   Wt 255 lb (115.7 kg)   BMI 35.57 kg/m   Physical Exam  Constitutional: He is oriented to person, place, and time. He appears well-developed and well-nourished.  Eyes: EOM are normal. Pupils are equal, round, and reactive to light.  Neck: Normal range of motion. Neck supple.  Pulmonary/Chest: Effort normal and breath sounds normal.  Abdominal: Soft. Bowel sounds are normal.  Musculoskeletal: He exhibits tenderness.  Neurological: He is alert and oriented to person, place, and time. He displays normal reflexes. No cranial nerve deficit or sensory deficit. He exhibits normal muscle tone. Coordination normal.  Skin: Skin is warm and dry.  Psychiatric: He has a normal mood and affect. His behavior is normal. Judgment and thought content normal.    Right Knee Exam  Right knee exam is normal.  Muscle Strength  The patient has normal right knee strength.   Left Knee Exam  Left knee exam is normal.  Muscle Strength   The patient has normal left knee strength.   Back Exam   Tenderness  The patient is experiencing tenderness in the lumbar.  Range of Motion  Extension: normal  Flexion: normal  Lateral Bend Right: abnormal  Lateral Bend Left: abnormal  Rotation Right: abnormal  Rotation Left: abnormal   Muscle Strength  Right Quadriceps:  5/5  Left Quadriceps:  5/5  Right Hamstrings:  5/5  Left Hamstrings:  5/5   Tests  Straight leg raise  right: negative Straight leg raise left: negative  Reflexes  Patellar:  2/4 normal Achilles: 2/4 Babinski's sign: normal   Other  Toe Walk: normal Heel Walk: normal Sensation: normal Gait: normal  Erythema: no back redness Scars: present  Comments:  Normal strength and SLR is negative., Well tanned.      Specialty Comments:  No specialty comments available.  Imaging: Xr Lumbar Spine 2-3 Views  Result Date: 12/15/2015 AP and Lateral flexion and extension views of the lumbar,  Multilevel DDD L1-2 , L2-3, L3-4 an L4-5, with anterior osteophyes and disc space narrowing L3-4 and L4-5 greater than the rest.    PMFS History: Patient Active Problem List   Diagnosis Date Noted  . Primary osteoarthritis of left knee 05/28/2015  . Primary osteoarthritis of right knee 01/13/2015  . Arthritis of knee 01/13/2015  . Fatigue due to sleep pattern disturbance 03/12/2014  . Nocturia more than twice per night 03/12/2014  . DM (diabetes mellitus) type 2, uncontrolled, with ketoacidosis (HCC) 03/12/2014  . OSA on CPAP 03/12/2013   Past Medical History:  Diagnosis Date  . Diabetes (HCC)   . Disturbance of skin sensation   . Dizziness   . Erectile dysfunction   . Headache   . Hyperlipidemia   . Hypertension   . OSA on CPAP   . Osteoarthritis     Family History  Problem Relation Age of Onset  . Diabetes Father   . Prostate cancer Father   . Cancer Paternal Aunt   . Diabetes Sister     Past Surgical History:  Procedure Laterality Date  . TOTAL KNEE ARTHROPLASTY Right 01/13/2015  . TOTAL KNEE ARTHROPLASTY Right 01/13/2015   Procedure: TOTAL KNEE ARTHROPLASTY;  Surgeon: Gean BirchwoodFrank Rowan, MD;  Location: [facility] OR;  Service: Orthopedics;  Laterality: Right;  . TOTAL KNEE ARTHROPLASTY Left 05/31/2015   Procedure: TOTAL LEFT KNEE ARTHROPLASTY;  Surgeon: Gean BirchwoodFrank Rowan, MD;  Location: [facility] OR;  Service: Orthopedics;  Laterality: Left;   Social History   Occupational History  . Carpenter     Social History Main Topics  . Smoking status: Former Smoker    Years: 20.00  . Smokeless tobacco: Never Used     Comment: 2003  . Alcohol use 0.0 oz/week     Comment: twice a week  . Drug use: No  . Sexual activity: Not on file

## 2015-12-15 NOTE — Patient Instructions (Signed)
Avoid bending, stooping and avoid lifting weights greater than 10 lbs. Avoid prolong standing and walking. Avoid frequent bending and stooping  No lifting greater than 10 lbs. May use ice or moist heat for pain. Weight loss is of benefit. Handicap license is approved. Diclofenac twice a day for inflamation. Start gabapentin 100 mg po at night for neurogenic pain. Use a stationary bike or  Pool walking program.

## 2015-12-16 ENCOUNTER — Telehealth (INDEPENDENT_AMBULATORY_CARE_PROVIDER_SITE_OTHER): Payer: Self-pay | Admitting: *Deleted

## 2015-12-16 ENCOUNTER — Telehealth (INDEPENDENT_AMBULATORY_CARE_PROVIDER_SITE_OTHER): Payer: Self-pay | Admitting: Specialist

## 2015-12-16 NOTE — Telephone Encounter (Signed)
Pt called stating he needs documentation for Handicapped sticker.

## 2015-12-16 NOTE — Telephone Encounter (Signed)
Duplicate, will speak with Dr. Otelia SergeantNitka and will call when completed. Thanks.

## 2015-12-17 NOTE — Telephone Encounter (Signed)
Called patient and advised handicap form is completed and ready to be picked up Thanks Avery DennisonHeather

## 2016-02-03 ENCOUNTER — Encounter (INDEPENDENT_AMBULATORY_CARE_PROVIDER_SITE_OTHER): Payer: Self-pay | Admitting: Specialist

## 2016-02-03 ENCOUNTER — Ambulatory Visit (INDEPENDENT_AMBULATORY_CARE_PROVIDER_SITE_OTHER): Payer: Self-pay

## 2016-02-03 ENCOUNTER — Ambulatory Visit (INDEPENDENT_AMBULATORY_CARE_PROVIDER_SITE_OTHER): Payer: BLUE CROSS/BLUE SHIELD | Admitting: Specialist

## 2016-02-03 VITALS — BP 154/89 | HR 79 | Ht 71.5 in | Wt 255.0 lb

## 2016-02-03 DIAGNOSIS — M25512 Pain in left shoulder: Secondary | ICD-10-CM

## 2016-02-03 DIAGNOSIS — G8929 Other chronic pain: Secondary | ICD-10-CM | POA: Diagnosis not present

## 2016-02-03 DIAGNOSIS — M48062 Spinal stenosis, lumbar region with neurogenic claudication: Secondary | ICD-10-CM

## 2016-02-03 DIAGNOSIS — M5136 Other intervertebral disc degeneration, lumbar region: Secondary | ICD-10-CM | POA: Diagnosis not present

## 2016-02-03 DIAGNOSIS — M542 Cervicalgia: Secondary | ICD-10-CM | POA: Diagnosis not present

## 2016-02-03 DIAGNOSIS — M5412 Radiculopathy, cervical region: Secondary | ICD-10-CM | POA: Diagnosis not present

## 2016-02-03 DIAGNOSIS — M5441 Lumbago with sciatica, right side: Secondary | ICD-10-CM | POA: Diagnosis not present

## 2016-02-03 NOTE — Progress Notes (Signed)
Office Visit Note   Patient: Darin KoyanagiBill Conner           Date of Birth: [birth year]           MRN: [id number] Visit Date: 02/03/2016              Requested by: Deatra JamesVyvyan Sun, MD [phone] [address] [place], [place] [address] PCP: Leanor RubensteinSUN,VYVYAN Y, MD   Assessment & Plan: Visit Diagnoses:  1. Chronic bilateral low back pain with right-sided sciatica   2. Degenerative disc disease, lumbar   3. Spinal stenosis of lumbar region with neurogenic claudication   4. Neck pain   5. Radiculopathy, cervical region   6. Chronic left shoulder pain     Plan: With patient's complaints today we'll  schedule cervical and lumbar spine MRI scans rule out HNP/stenosis and follow with Dr. Otelia SergeantNitka after to discuss results and further treatment options. He has not had any improvement with previous medications and therapy. He will continue to follow up with Dr. Thyra BreedMark Phillips for pain management. Patient is also been having ongoing pain in his left shoulder and we will get a 3 view x-ray at return office visit.  Follow-Up Instructions: Return for nitka after MRI cervical and lumbar to review.   Orders:  Orders Placed This Encounter  Procedures  . XR Cervical Spine 2 or 3 views  . MR Lumbar Spine w/o contrast  . MR Cervical Spine w/o contrast   No orders of the defined types were placed in this encounter.      Procedures: No procedures performed   Clinical Data: No additional findings.   Subjective: Chief Complaint  Patient presents with  . Lower Back - Follow-up, Pain    Darin Conner is here to follow up on low back pain, he states that it still hurts. He is having a hard time doing exercises and it hurts when he tries to do things.   He is also complaining of some neck pain.  He states that when he rotates his arm he has neck pain.    States that his neck pain as been ongoing for about 8 years. Left shoulder pain about the same time. Neck pain radiates into the left greater than right scapular  region. Complains of intermittent numbness and tingling in both hands. Left shoulder pain also aggravated with overhead activity and reaching behind his back. Pain in the left shoulder at night when he lies on his left side area. shoulder does pop with movement. No instability. Review of Systems  Constitutional: Positive for activity change.  HENT: Negative.   Genitourinary: Negative.   Musculoskeletal: Positive for back pain and neck pain.  Neurological: Positive for numbness.  Psychiatric/Behavioral: Negative.      Objective: Vital Signs: BP (!) 154/89 (BP Location: Left Arm, Patient Position: Sitting)   Pulse 79   Ht 5' 11.5" (1.816 m)   Wt 255 lb (115.7 kg)   BMI 35.07 kg/m   Physical Exam  Constitutional: He is oriented to person, place, and time. No distress.  HENT:  Head: Normocephalic.  Eyes: EOM are normal. Pupils are equal, round, and reactive to light.  Pulmonary/Chest: Effort normal. No respiratory distress.  Musculoskeletal:  Service spine good range of motion. Left brachial plexus trapezius and scapular border tenderness. Shoulder has good range of motion but with discomfort on overhead. He does have positive crepitus with movement. Positive impingement test. Pain with supraspinatus resistance. No cuff weakness. Negative drop arm test. Right shoulder unremarkable.  Bilateral elbows good range of motion. Negative Tinel's over the cubital tunnels. Bilateral wrist negative Tinel's and Phalen's. Negative logroll bilateral hips. Negative straight leg raise. No focal motor deficits throughout.  Neurological: He is alert and oriented to person, place, and time.  Skin: Skin is warm and dry.  Psychiatric: He has a normal mood and affect.    Ortho Exam  Specialty Comments:  No specialty comments available.  Imaging: No results found.   PMFS History: Patient Active Problem List   Diagnosis Date Noted  . Primary osteoarthritis of left knee 05/28/2015  . Primary  osteoarthritis of right knee 01/13/2015  . Arthritis of knee 01/13/2015  . Fatigue due to sleep pattern disturbance 03/12/2014  . Nocturia more than twice per night 03/12/2014  . DM (diabetes mellitus) type 2, uncontrolled, with ketoacidosis (HCC) 03/12/2014  . OSA on CPAP 03/12/2013   Past Medical History:  Diagnosis Date  . Diabetes (HCC)   . Disturbance of skin sensation   . Dizziness   . Erectile dysfunction   . Headache   . Hyperlipidemia   . Hypertension   . OSA on CPAP   . Osteoarthritis     Family History  Problem Relation Age of Onset  . Diabetes Father   . Prostate cancer Father   . Cancer Paternal Aunt   . Diabetes Sister     Past Surgical History:  Procedure Laterality Date  . TOTAL KNEE ARTHROPLASTY Right 01/13/2015  . TOTAL KNEE ARTHROPLASTY Right 01/13/2015   Procedure: TOTAL KNEE ARTHROPLASTY;  Surgeon: Gean Birchwood, MD;  Location: [facility] OR;  Service: Orthopedics;  Laterality: Right;  . TOTAL KNEE ARTHROPLASTY Left 05/31/2015   Procedure: TOTAL LEFT KNEE ARTHROPLASTY;  Surgeon: Gean Birchwood, MD;  Location: [facility] OR;  Service: Orthopedics;  Laterality: Left;   Social History   Occupational History  . [employer]    Social History Main Topics  . Smoking status: Former Smoker    Years: 20.00  . Smokeless tobacco: Never Used     Comment: 2003  . Alcohol use 0.0 oz/week     Comment: twice a week  . Drug use: No  . Sexual activity: Not on file

## 2016-02-23 ENCOUNTER — Other Ambulatory Visit (INDEPENDENT_AMBULATORY_CARE_PROVIDER_SITE_OTHER): Payer: Self-pay | Admitting: Specialist

## 2016-02-23 MED ORDER — ALPRAZOLAM 0.5 MG PO TABS: ORAL_TABLET | ORAL | 0 refills | Status: DC

## 2016-02-25 ENCOUNTER — Ambulatory Visit (HOSPITAL_COMMUNITY)
Admission: RE | Admit: 2016-02-25 | Discharge: 2016-02-25 | Disposition: A | Discharge status: Home / Self Care | Payer: BLUE CROSS/BLUE SHIELD | Source: Ambulatory Visit | Attending: Surgery | Admitting: Surgery

## 2016-02-25 DIAGNOSIS — M47896 Other spondylosis, lumbar region: Secondary | ICD-10-CM | POA: Diagnosis not present

## 2016-02-25 DIAGNOSIS — M5412 Radiculopathy, cervical region: Secondary | ICD-10-CM | POA: Diagnosis present

## 2016-02-25 DIAGNOSIS — E882 Lipomatosis, not elsewhere classified: Secondary | ICD-10-CM | POA: Insufficient documentation

## 2016-02-25 DIAGNOSIS — M5441 Lumbago with sciatica, right side: Secondary | ICD-10-CM

## 2016-02-25 DIAGNOSIS — M5136 Other intervertebral disc degeneration, lumbar region: Secondary | ICD-10-CM | POA: Insufficient documentation

## 2016-02-25 DIAGNOSIS — M4802 Spinal stenosis, cervical region: Secondary | ICD-10-CM | POA: Diagnosis not present

## 2016-02-25 DIAGNOSIS — M5137 Other intervertebral disc degeneration, lumbosacral region: Secondary | ICD-10-CM | POA: Insufficient documentation

## 2016-02-25 DIAGNOSIS — G8929 Other chronic pain: Secondary | ICD-10-CM

## 2016-03-14 ENCOUNTER — Ambulatory Visit (INDEPENDENT_AMBULATORY_CARE_PROVIDER_SITE_OTHER): Payer: BLUE CROSS/BLUE SHIELD | Admitting: Specialist

## 2016-03-14 ENCOUNTER — Encounter (INDEPENDENT_AMBULATORY_CARE_PROVIDER_SITE_OTHER): Payer: Self-pay | Admitting: Specialist

## 2016-03-14 VITALS — BP 130/88 | HR 76 | Ht 71.5 in | Wt 255.0 lb

## 2016-03-14 DIAGNOSIS — M48062 Spinal stenosis, lumbar region with neurogenic claudication: Secondary | ICD-10-CM

## 2016-03-14 DIAGNOSIS — M4802 Spinal stenosis, cervical region: Secondary | ICD-10-CM

## 2016-03-14 NOTE — Patient Instructions (Signed)
Avoid frequent bending and stooping  No lifting greater than 10 lbs. May use ice or moist heat for pain. Weight loss is of benefit. Handicap license is approved. Avoid bending, stooping and avoid lifting weights greater than 10 lbs. Avoid prolong standing and walking. Avoid frequent bending and stooping  No lifting greater than 10 lbs. May use ice or moist heat for pain. Weight loss is of benefit.

## 2016-03-14 NOTE — Progress Notes (Signed)
Office Visit Note   Patient: Darin Conner           Date of Birth: September 18, 1955           MRN: [id number] Visit Date: 03/14/2016              Requested by: Deatra James, MD [phone] [address] [place], [place] [address] PCP: Leanor Rubenstein, MD   Assessment & Plan: Visit Diagnoses:  1. Spinal stenosis of cervical region   2. Spinal stenosis of lumbar region with neurogenic claudication     Plan: Avoid frequent bending and stooping  No lifting greater than 10 lbs. May use ice or moist heat for pain. Weight loss is of benefit. Handicap license is approved. Avoid bending, stooping and avoid lifting weights greater than 10 lbs. Avoid prolong standing and walking. Avoid frequent bending and stooping  No lifting greater than 10 lbs. May use ice or moist heat for pain. Weight loss is of benefit.    Follow-Up Instructions: Return in about 4 weeks (around 04/11/2016).   Orders:  No orders of the defined types were placed in this encounter.  No orders of the defined types were placed in this encounter.     Procedures: No procedures performed   Clinical Data: No additional findings.   Subjective: Chief Complaint  Patient presents with  . Neck - Follow-up    MRI Review  . Lower Back - Follow-up    MRI Review     Darin Conner is here to review MRI's of his Cervical and Lumbar spines.  He states that he is still hurting.  Otherwise no other changes in symptoms.    Review of Systems   Objective: Vital Signs: BP 130/88 (BP Location: Left Arm, Patient Position: Sitting)   Pulse 76   Ht 5' 11.5" (1.816 m)   Wt 255 lb (115.7 kg)   BMI 35.07 kg/m   Physical Exam  Back Exam   Tenderness  The patient is experiencing tenderness in the lumbar and cervical.  Range of Motion  Extension: normal  Lateral Bend Right: normal  Lateral Bend Left: normal  Rotation Right: normal  Rotation Left: normal   Muscle Strength  Right Quadriceps:  5/5  Left Quadriceps:   5/5  Right Hamstrings:  5/5  Left Hamstrings:  5/5   Tests  Straight leg raise right: negative Straight leg raise left: negative  Reflexes  Patellar: normal Achilles: normal Biceps: normal Babinski's sign: normal   Other  Toe Walk: normal Heel Walk: normal Sensation: normal Gait: normal  Erythema: no back redness Scars: absent   Right Shoulder Exam  Right shoulder exam is normal.   Left Shoulder Exam   Tenderness  The patient is experiencing tenderness in the acromion.  Range of Motion  Active Abduction: abnormal  Passive Abduction: abnormal  Extension: abnormal  Forward Flexion: normal  External Rotation: abnormal   Muscle Strength  Abduction: 4/5  Internal Rotation: 5/5  External Rotation: 4/5  Supraspinatus: 5/5  Subscapularis: 4/5   Tests  Impingement: positive  Other  Erythema: absent Scars: absent Sensation: normal Pulse: present       Specialty Comments:  No specialty comments available.  Imaging: No results found.   PMFS History: Patient Active Problem List   Diagnosis Date Noted  . Primary osteoarthritis of left knee 05/28/2015  . Primary osteoarthritis of right knee 01/13/2015  . Arthritis of knee 01/13/2015  . Fatigue due to sleep pattern disturbance 03/12/2014  . Nocturia  more than twice per night 03/12/2014  . DM (diabetes mellitus) type 2, uncontrolled, with ketoacidosis (HCC) 03/12/2014  . OSA on CPAP 03/12/2013   Past Medical History:  Diagnosis Date  . Diabetes (HCC)   . Disturbance of skin sensation   . Dizziness   . Erectile dysfunction   . Headache   . Hyperlipidemia   . Hypertension   . OSA on CPAP   . Osteoarthritis     Family History  Problem Relation Age of Onset  . Diabetes Father   . Prostate cancer Father   . Cancer Paternal Aunt   . Diabetes Sister     Past Surgical History:  Procedure Laterality Date  . TOTAL KNEE ARTHROPLASTY Right 01/13/2015  . TOTAL KNEE ARTHROPLASTY Right 01/13/2015    Procedure: TOTAL KNEE ARTHROPLASTY;  Surgeon: Gean BirchwoodFrank Rowan, MD;  Location: [facility] OR;  Service: Orthopedics;  Laterality: Right;  . TOTAL KNEE ARTHROPLASTY Left 05/31/2015   Procedure: TOTAL LEFT KNEE ARTHROPLASTY;  Surgeon: Gean BirchwoodFrank Rowan, MD;  Location: [facility] OR;  Service: Orthopedics;  Laterality: Left;   Social History   Occupational History  . [employer]    Social History Main Topics  . Smoking status: Former Smoker    Years: 20.00  . Smokeless tobacco: Never Used     Comment: 2003  . Alcohol use 0.0 oz/week     Comment: twice a week  . Drug use: No  . Sexual activity: Not on file

## 2016-03-15 ENCOUNTER — Ambulatory Visit (INDEPENDENT_AMBULATORY_CARE_PROVIDER_SITE_OTHER): Payer: Self-pay | Admitting: Surgery

## 2016-04-12 ENCOUNTER — Encounter: Payer: Self-pay | Admitting: Neurology

## 2016-04-26 ENCOUNTER — Telehealth (INDEPENDENT_AMBULATORY_CARE_PROVIDER_SITE_OTHER): Payer: Self-pay | Admitting: Specialist

## 2016-04-26 NOTE — Telephone Encounter (Signed)
OXNER PERMAR CALLED TO CHECK STATUS OF RECORDS REQUEST. I ADVISED WE DO NOT HAVE ONE. I GAVE HER OUR FAX NUMBER FOR HER TO REFAX.

## 2016-06-08 ENCOUNTER — Ambulatory Visit (INDEPENDENT_AMBULATORY_CARE_PROVIDER_SITE_OTHER): Payer: BLUE CROSS/BLUE SHIELD | Admitting: Neurology

## 2016-06-08 ENCOUNTER — Encounter: Payer: Self-pay | Admitting: Neurology

## 2016-06-08 VITALS — BP 141/86 | HR 76 | Ht 71.5 in | Wt 270.0 lb

## 2016-06-08 DIAGNOSIS — Z9989 Dependence on other enabling machines and devices: Secondary | ICD-10-CM

## 2016-06-08 DIAGNOSIS — G4733 Obstructive sleep apnea (adult) (pediatric): Secondary | ICD-10-CM | POA: Diagnosis not present

## 2016-06-08 DIAGNOSIS — G63 Polyneuropathy in diseases classified elsewhere: Secondary | ICD-10-CM

## 2016-06-08 DIAGNOSIS — E349 Endocrine disorder, unspecified: Secondary | ICD-10-CM

## 2016-06-08 NOTE — Patient Instructions (Signed)
Hypersomnia Hypersomnia is when you feel extremely tired during the day even though you're getting plenty of sleep at night. You may need to take naps during the day, and you may also be extremely difficult to wake up when you are sleeping. What are the causes? The cause of your hypersomnia may not be known. Hypersomnia may be caused by:  Medicines.  Sleep disorders, such as narcolepsy.  Trauma or injury to your head or nervous system.  Using drugs or alcohol.  Tumors.  Medical conditions, such as depression or hypothyroidism.  Genetics. What are the signs or symptoms? The main symptoms of hypersomnia include:  Feeling extremely tired throughout the day.  Being very difficult to wake up.  Sleeping for longer and longer periods.  Taking naps throughout the day. Other symptoms may include:  Feeling:  Restless.  Annoyed.  Anxious.  Low energy.  Having difficulty:  Remembering.  Speaking.  Thinking.  Losing your appetite.  Experiencing hallucinations. How is this diagnosed? Hypersomnia may be diagnosed by:  Medical history and physical exam. This will include a sleep history.  Completing sleep logs.  Tests may also be done, such as:  Polysomnography.  Multiple sleep latency test (MSLT). How is this treated? There is no cure for hypersomnia, but treatment can be very effective in helping manage the condition. Treatment may include:  Lifestyle and sleeping strategies to help cope with the condition.  Stimulant medicines.  Treating any underlying causes of hypersomnia. Follow these instructions at home:  Take medicines only as directed by your health care provider.  Schedule short naps for when you feel sleepiest during the day. Tell your employer or teachers that you have hypersomnia. You may be able to adjust your schedule to include time for naps.  Avoid drinking alcohol or caffeinated beverages.  Do not eat a heavy meal before bedtime. Eat  at about the same times every day.  Do not drive or operate heavy machinery if you are sleepy.  Do not swim or go out on the water without a life jacket.  If possible, adjust your schedule so that you do not have to work or be active at night.  Keep all follow-up visits as directed by your health care provider. This is important. Contact a health care provider if:  You have new symptoms.  Your symptoms get worse. Get help right away if: You have serious thoughts of hurting yourself or someone else. This information is not intended to replace advice given to you by your health care provider. Make sure you discuss any questions you have with your health care provider. Document Released: 01/06/2002 Document Revised: 06/24/2015 Document Reviewed: 08/21/2013 [employer] Interactive Patient Education  2017 [employer]  

## 2016-06-08 NOTE — Progress Notes (Signed)
[facility]  PATIENT: Darin Conner DOB: 1955-09-10   REASON FOR VISIT: Follow-up for sleep apnea with CPAP HISTORY FROM: Patient   Interval history from 06/08/2016, [place]; he is very sun tanned and outdoor active. He has bilateral knee replacements his right knee still looks puffy and swollen in comparison to the left but he reports having trouble with his left knee which may have to be redone. He gained some weight over the winter and hopes to lose it as the summer Mnire's and he has more outdoor movement opportunity. He is a compliant CPAP user 83% compliance with an average user time of 6 hours 28 minutes, using an AutoSet between 5 and 15 cm water with 2 cm EPR. His residual AHI is 1.5, which is a very good resolution. The 91st percentile pressure is 14.8, it straddles the upper limit of his AutoSet. He has minor leaks. The needs to be no change he likes his fullface mask, which is the [employer].  HISTORY OF PRESENT ILLNESS:UPDATE 05/11/2017CM Mr Conner, 61 year old male returns for yearly follow-up for his obstructive sleep apnea. His compliance report from 03/12/2015 to 06/09/2015 shows 82% compliance for 74 days and  5 days with less then 4 hours. He had recent knee replacement was in the hospital for 4 days. Average usage 6 hours 24 minutes pressure 5 cm. AHI 1.3 at auto titration no leaks. ESS score today is 22 -however he is on pain medication. Fatigue severity scale is 33. He has not started his rehabilitation for his left knee replacement as yet.  He is ambulating with a quad cane, he returns for reevaluation.  HISTORY:(CD)Darin Conner, a 61 year old Caucasian right-handed gentleman, presents here as a new patient to the sleep clinic. PMHx: Dr. Merri Brunette office stated that the patient has a known history of diabetes which has become less controlled over the last 12 months. He was treated for essential hypertension,  mainly with diuretics and calcium channel blocker, and he has gastroesophageal reflux disease.He used to be a patient of Dr. Elvera Lennox. Laboratory results from [facility]- an increase in HB A1c now at 5.9, his cholesterol panel on treatment had improved.  There were normal liver and kidney function snorted. The patient requested a referral to see a doctor for sleep apnea followup. The sleep studies documented the following:The patient had a sleep latency of 32 minutes and a sleep efficiency of only 71.2%.  He slept for more than 40% of the night and supine sleep position- but the REM sleep was only noted when he slept on his left side. An AHI index of 81.4 was stated for the left lateral position and a 54.24 the supine position. The RDIs were similar. AHI for total sleep time was 48.4 /hr. . CPAP titration was initiated and the patient titrated to 11 cm water ,with an AHI of 0.0 . There was 100% supine sleep and it was noted that his hypoxemia resolved on the CPAP. He developed periodic limb movements with CPAP therapy.  He was fitted with a full face mask which began pinching his nose and he still has visible.pressure marks. He felt uncomfortable "had tonegotiate" with the [employer] to get a new mask. 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  The patient is seen here on 03-12-14 after continued to use his old CPAP machine with new settings. A download confirmed a 2.2 residual AHI at a CPAP pressure of 12  cm water. This was on 03-11-2013. He feels a little better since he got new equipment mask tubing etc. that he still feels not up to 100%. Also his average residual AHI was very low I wonder if the patient would do better with an auto titration device. His machine is also partially broken, I think in that case he needs a new machine. He has been followed by pediatric specialists as his DME. A download from 03-11-14 obtained in the office shows 100% compliance at 6 hours and  30 minutes average with a average AHI of 2.6 at 11 cm water. Darin Conner endorsed his fatigue severity scale at 33 points today and the Epworth Sleepiness Scale at 6 points his fatigue score is actually higher than last time his sleepiness score is lower.  REVIEW OF SYSTEMS: Full 14 system review of systems performed and notable only for those listed, all others are neg:   Endocrine: Excessive thirst  Musculoskeletal: Joint pain  Sleep : Obstructive sleep apnea with CPAP Hypersomnia, Epworth score is 15 ! FSS 54 points. Sleepiness was 22 last visit , was much increased on pain medication..   ALLERGIES: Allergies  Allergen Reactions  . Penicillins Other (See Comments)    Unspecified  Has patient had a PCN reaction causing immediate rash, facial/tongue/throat swelling, SOB or lightheadedness with hypotension: YES Has patient had a PCN reaction causing severe rash involving mucus membranes or skin necrosis: NO Has patient had a PCN reaction that required hospitalization NO Has patient had a PCN reaction occurring within the last 10 years: NO If all of the above answers are "NO", then may proceed with Cephalosporin use.      HOME MEDICATIONS: Outpatient Medications Prior to Visit  Medication Sig Dispense Refill  . amLODipine-olmesartan (AZOR) 5-40 MG tablet Take 1 tablet by mouth daily.    Marland Kitchen aspirin EC 325 MG tablet Take 1 tablet (325 mg total) by mouth 2 (two) times daily. 30 tablet 0  . gabapentin (NEURONTIN) 100 MG capsule Take 1 capsule (100 mg total) by mouth at bedtime. 30 capsule 6  . glipiZIDE-metformin (METAGLIP) 5-500 MG tablet     . glucose blood test strip 1 each by Other route 2 (two) times daily. Use as instructed    . methocarbamol (ROBAXIN) 500 MG tablet Take 1 tablet (500 mg total) by mouth 2 (two) times daily with a meal. 60 tablet 0  . Omega-3 Fatty Acids (OMEGA 3 PO) Take 1 capsule by mouth daily.    . pravastatin (PRAVACHOL) 20 MG tablet Take 20 mg by mouth daily.      . ranitidine (ZANTAC 75) 75 MG tablet Take 75 mg by mouth 2 (two) times daily as needed for heartburn (Chewable).    . Triamcinolone Acetonide (NASACORT ALLERGY 24HR NA) Place 1 spray into the nose daily as needed (Allergies).    . triamterene-hydrochlorothiazide (DYAZIDE) 37.5-25 MG per capsule Take 1 capsule by mouth daily.    . vitamin C (ASCORBIC ACID) 250 MG tablet Take 250 mg by mouth daily.    Marland Kitchen ALPRAZolam (XANAX) 0.5 MG tablet Take one tablet prior to MRI, may repeat x 1. Do not take until you have signed any paperwork that the radiology suite may require. 2 tablet 0  . JANUMET 50-1000 MG per tablet Take 1 tablet by mouth daily.   4  . oxyCODONE-acetaminophen (ROXICET) 5-325 MG tablet Take 1 tablet by mouth every 4 (four) hours as needed. 60 tablet 0   No facility-administered medications prior  to visit.     PAST MEDICAL HISTORY: Past Medical History:  Diagnosis Date  . Diabetes (HCC)   . Disturbance of skin sensation   . Dizziness   . Erectile dysfunction   . Headache   . Hyperlipidemia   . Hypertension   . OSA on CPAP   . Osteoarthritis     PAST SURGICAL HISTORY: Past Surgical History:  Procedure Laterality Date  . TOTAL KNEE ARTHROPLASTY Right 01/13/2015  . TOTAL KNEE ARTHROPLASTY Right 01/13/2015   Procedure: TOTAL KNEE ARTHROPLASTY;  Surgeon: Gean Birchwood, MD;  Location: [facility] OR;  Service: Orthopedics;  Laterality: Right;  . TOTAL KNEE ARTHROPLASTY Left 05/31/2015   Procedure: TOTAL LEFT KNEE ARTHROPLASTY;  Surgeon: Gean Birchwood, MD;  Location: [facility] OR;  Service: Orthopedics;  Laterality: Left;    FAMILY HISTORY: Family History  Problem Relation Age of Onset  . Diabetes Father   . Prostate cancer Father   . Cancer Paternal Aunt   . Diabetes Sister     SOCIAL HISTORY: Social History   Social History  . Marital status: Married    Spouse name: Joni Reining  . Number of children: 5  . Years of education: GED   Occupational History  . Carpenter    Social History  Main Topics  . Smoking status: Former Smoker    Years: 20.00  . Smokeless tobacco: Never Used     Comment: 2003  . Alcohol use 0.0 oz/week     Comment: twice a week  . Drug use: No  . Sexual activity: Not on file   Other Topics Concern  . Not on file   Social History Narrative   Patient is married Joni Reining).   Patient has five children.   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   Patient has a Ged.   Patient drinks 3-4 cups of coffee daily.   Patient is right-handed.              PHYSICAL EXAM  Vitals:   06/08/16 0938  BP: (!) 141/86  Pulse: 76  Weight: 270 lb (122.5 kg)  Height: 5' 11.5" (1.816 m)   Body mass index is 37.13 kg/m. General: The patient is awake, alert and appears not in acute distress. The patient is well groomed. Head: Normocephalic, atraumatic.  Neck is supple. Mallampati 3 , neck circumference: 16  Cardiovascular: Regular rate and rhythm ,  Respiratory: Lungs are clear to auscultation. Skin: Without evidence of edema, or rash   Neurologic exam : The patient is awake and alert, oriented to place and time. Memory subjective described as intact. There is a normal attention span & concentration ability. Speech is fluent without dysarthria, dysphonia or aphasia.Mood and affect are appropriate.ESS 15  (on tramadol ).  Cranial nerves:Pupils are equal and briskly reactive to light. Extraocular movements in vertical and horizontal planes intact and without nystagmus. Visual fields by finger perimetry are intact.Hearing to finger rub intact. Facial sensation intact to fine touch.  Facial motor strength is symmetric and tongue and uvula move midline. Motor: normal bulk and tone, no pronator drift.  Sensory- vibration decreased in both feet above ankle line. Knee pain is joint related.  Coordination: finger-nose-finger without  dysmetria Reflexes:  2/2, plantar responses were attenuated  bilaterally. patellar pain with DTR  check. Left over right.  Gait and Station: Rising up from seated position without assistance, not bracing - uses a cane - 4 prongs. Limp on the left .  DIAGNOSTIC DATA (LABS, IMAGING, TESTING) -  I reviewed patient records, labs, notes, testing and imaging myself where available.  Lab Results  Component Value Date   WBC 9.3 06/02/2015   HGB 11.3 (L) 06/02/2015   HCT 33.5 (L) 06/02/2015   MCV 80.1 06/02/2015   PLT 139 (L) 06/02/2015      Component Value Date/Time   NA 136 06/01/2015 0328   K 3.7 06/01/2015 0328   CL 105 06/01/2015 0328   CO2 22 06/01/2015 0328   GLUCOSE 189 (H) 06/01/2015 0328   BUN 12 06/01/2015 0328   CREATININE 1.07 06/01/2015 0328   CALCIUM 8.8 (L) 06/01/2015 0328   GFRNONAA >60 06/01/2015 0328   GFRAA >60 06/01/2015 0328    Lab Results  Component Value Date   HGBA1C 6.3 (H) 05/31/2015    ASSESSMENT AND PLAN 61 y.o. year old male has a past medical history of Hyperlipidemia; Diabetes; Osteoarthritis; Disturbance of skin sensation;   Seen here for OSA on CPAP; hypersomnia  Related not to apnea, but leg- knee pain and use of pain medication and neurontin.  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  Marland Kitchen. He needs to lose weight before surgery.  Seen for OSA in routine follow-up. His CPAP download showed good resolution of apnea.    Continue CPAP on same settings.  Follow-up yearly and when necessary next with Dr. West Carboohmeier   Tama Grosz, MD   [facility] [address], [address] [place], [place] [phone]

## 2016-06-14 ENCOUNTER — Ambulatory Visit (INDEPENDENT_AMBULATORY_CARE_PROVIDER_SITE_OTHER): Payer: Self-pay | Admitting: Specialist

## 2018-10-05 IMAGING — MR MR CERVICAL SPINE W/O CM
4 of 5 series · 19 of 48 positions shown · IV contrast (Yes)
Comparison: None.

CLINICAL DATA: Neck pain with left upper extremity radiculopathy.

EXAM:
MRI CERVICAL SPINE WITHOUT CONTRAST
TECHNIQUE: Multiplanar, multisequence MR imaging of the cervical spine was
performed. No intravenous contrast was administered.

[Series 2: T1 · sagittal · 3.0mm · 0.41mm/px · 3 of 15 slices shown]
[im 3/15]
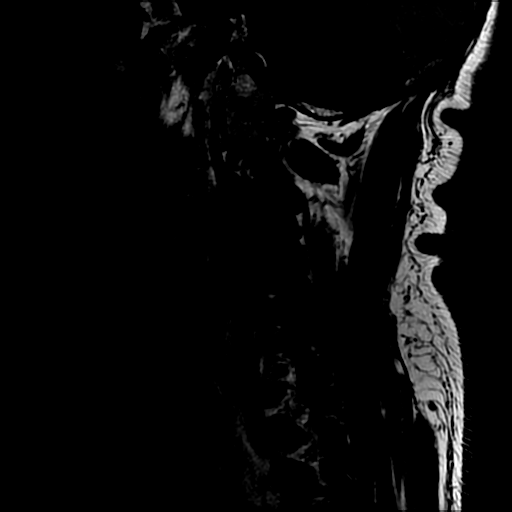
[im 9/15]
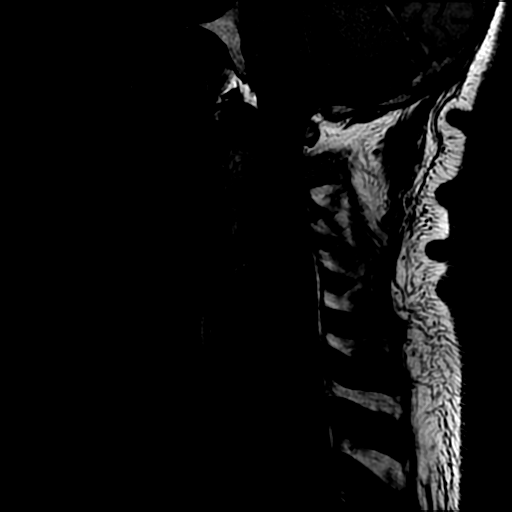
[im 15/15]
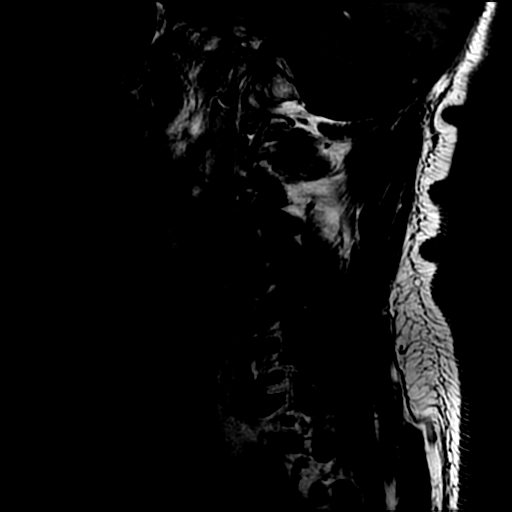

[Series 3: sag ir · sagittal · 3.0mm · 0.41mm/px · 3 of 15 slices shown]
[im 3/15]
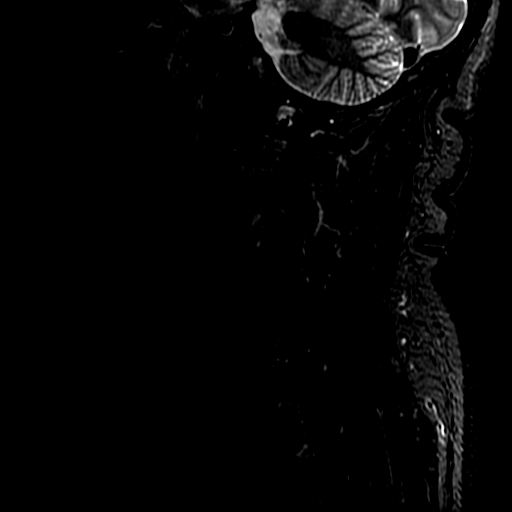
[im 8/15]
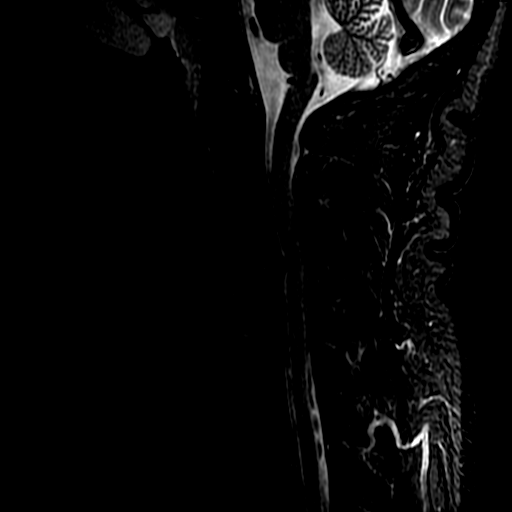
[im 12/15]
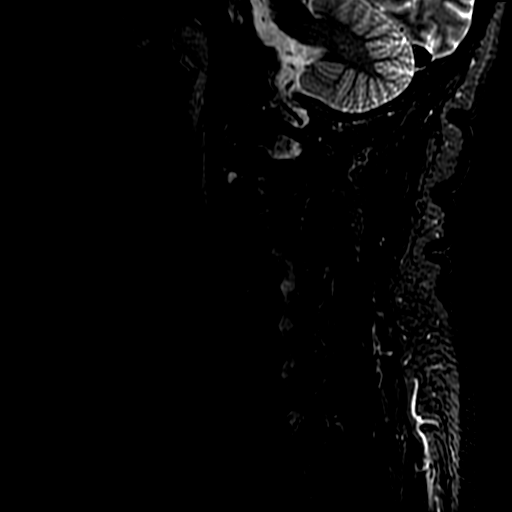

[Series 4: T2 post-contrast · sagittal · 3.0mm · 0.41mm/px · 7 of 15 slices shown]
[im 1/15]
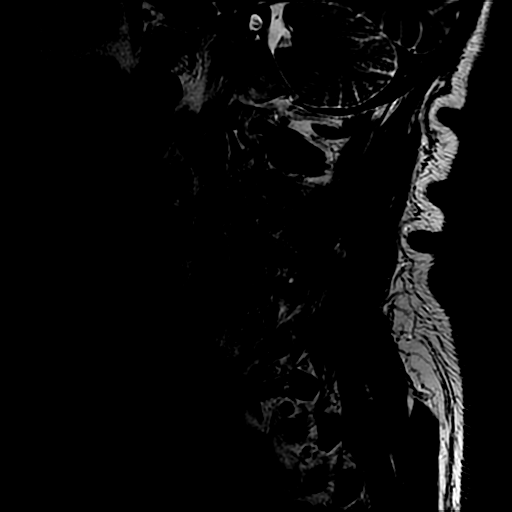
[im 3/15]
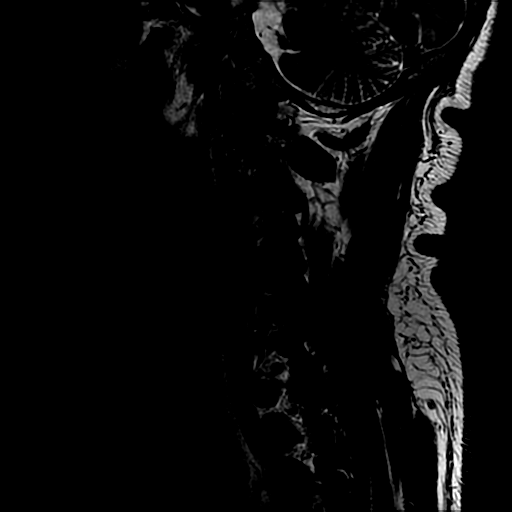
[im 5/15]
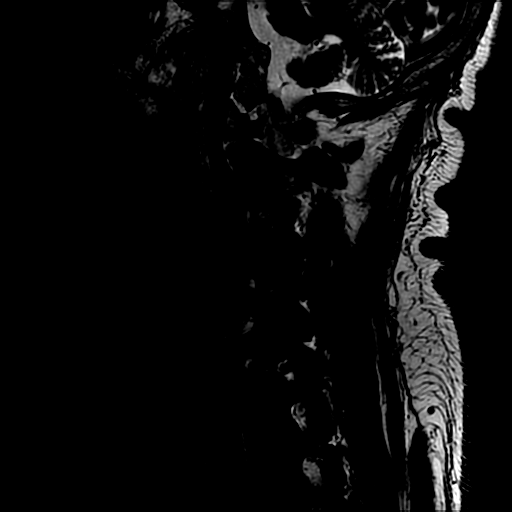
[im 8/15]
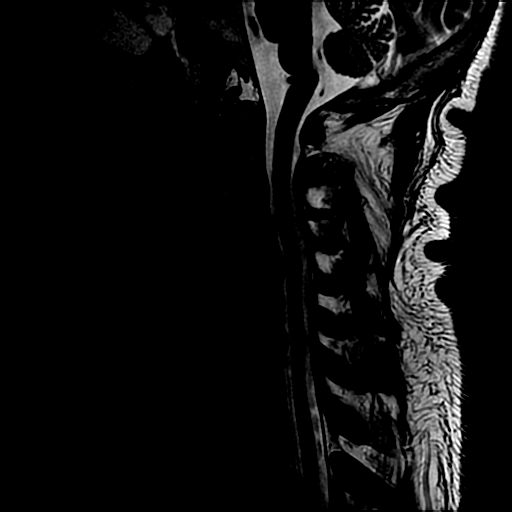
[im 10/15]
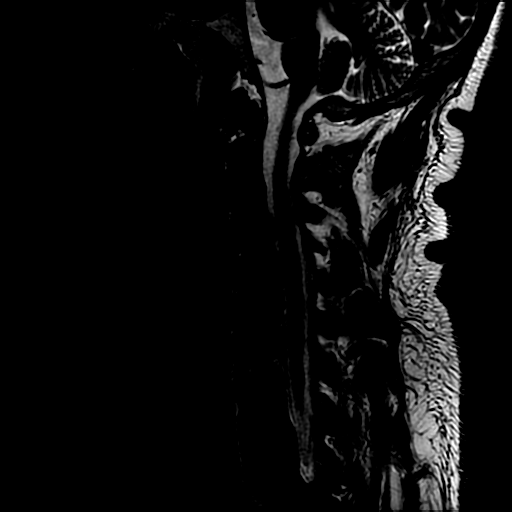
[im 12/15]
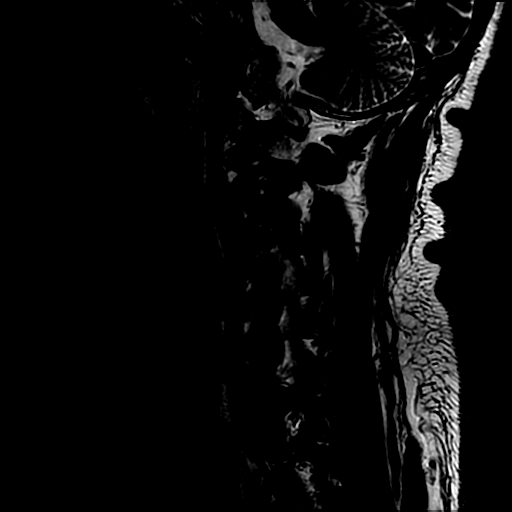
[im 15/15]
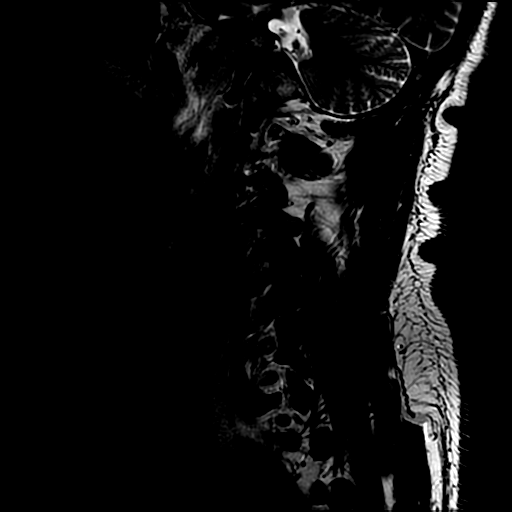

[Series 6: T2 · axial · 3.1mm · 0.35mm/px · z∈[-33,+46]mm · 6 of 29 slices shown]
[im 1/29]
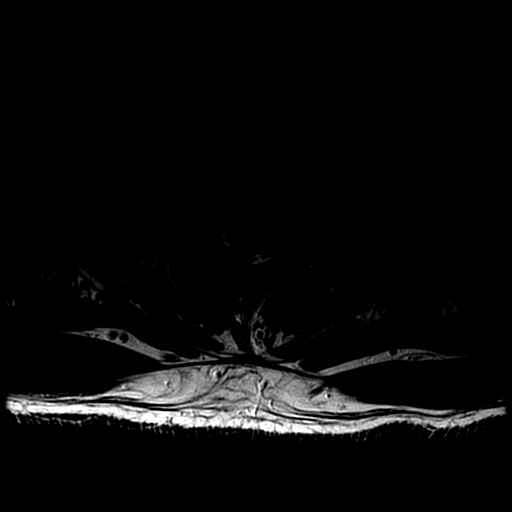
[im 5/29]
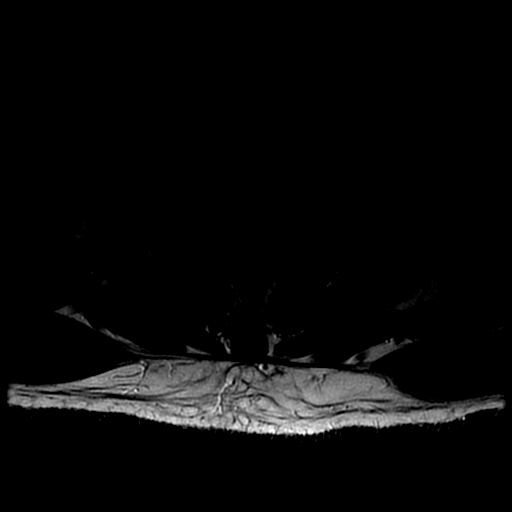
[im 9/29]
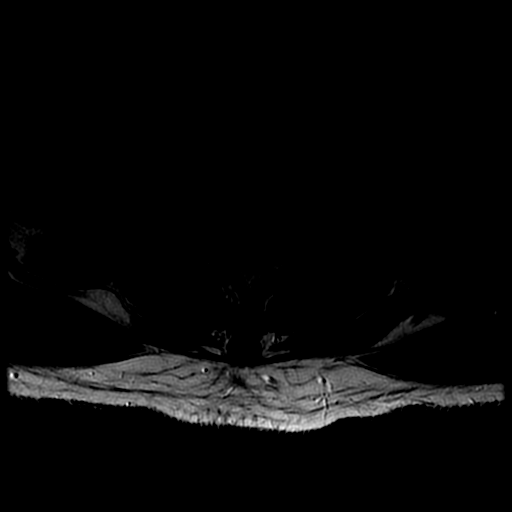
[im 13/29]
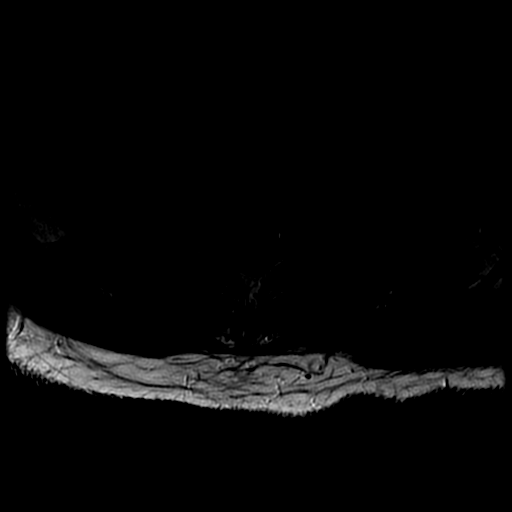
[im 16/29]
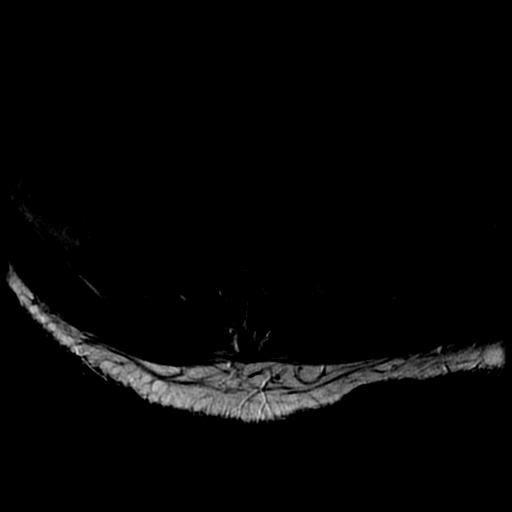
[im 24/29]
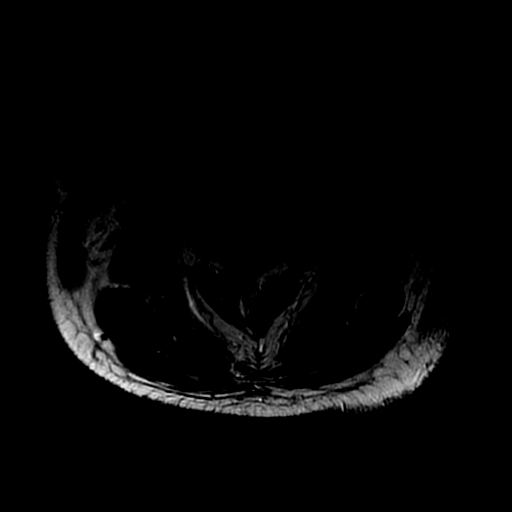

[19 of 48 positions shown; findings below may reference images not displayed]

FINDINGS: Alignment: Reversal of cervical lordosis from degenerative changes.
No listhesis.

Vertebrae: Marrow edema around the left C2-3 facet which is moderate
and could be symptomatic. No acute fracture, discitis, or aggressive
bone lesion.

Cord: Normal signal and morphology

Posterior Fossa, vertebral arteries, paraspinal tissues: Negative

Disc levels:

C2-3: Facet arthropathy with bulky spurring on the left where there
is also marrow edema. Small left uncovertebral spur. Moderate left
foraminal narrowing. Mild right foraminal narrowing. Patent spinal
canal

C3-4: Facet arthropathy with bulky spurring on the right. Borderline
anterolisthesis. Small uncovertebral spurs. Mild bilateral foraminal
narrowing. Patent spinal canal

C4-5: Disc degeneration with asymmetric left endplate and
uncovertebral ridging. Facet spurring greater on the left. Severe
left foraminal stenosis. Patent canal and right foramen.

C5-6: Disc narrowing with uncovertebral ridging. Negative facets.
Bilateral moderate foraminal stenosis. No impingement

C6-7: Disc degeneration with asymmetric left endplate and
uncovertebral ridging. No visible left foramen. Patent right foramen
and canal.

C7-T1:Unremarkable.
IMPRESSION: 1. Severe left foraminal stenosis at C4-5 and C6-7 due to
uncovertebral ridging.
2. Mild to moderate foraminal narrowings at C2-3, C3-4, and C5-6 as
described.
3. Upper cervical facet arthropathy, most notable on the left at
C2-3 where there is marrow edema.
